# Patient Record
Sex: Female | Born: 1950 | Race: Black or African American | Hispanic: No | Marital: Single | Smoking: Never smoker
Health system: Southern US, Community
[De-identification: ages and names within clinical notes are randomized; demographics above are authoritative.]

## PROBLEM LIST (undated history)

## (undated) DIAGNOSIS — E7800 Pure hypercholesterolemia, unspecified: Secondary | ICD-10-CM

## (undated) DIAGNOSIS — I10 Essential (primary) hypertension: Secondary | ICD-10-CM

---

## 2010-12-31 ENCOUNTER — Emergency Department (HOSPITAL_BASED_OUTPATIENT_CLINIC_OR_DEPARTMENT_OTHER)
Admission: EM | Admit: 2010-12-31 | Discharge: 2010-12-31 | Disposition: A | Discharge status: Home / Self Care | Payer: BC Managed Care – PPO | Attending: Emergency Medicine | Admitting: Emergency Medicine

## 2010-12-31 DIAGNOSIS — H5710 Ocular pain, unspecified eye: Secondary | ICD-10-CM | POA: Insufficient documentation

## 2010-12-31 DIAGNOSIS — E7800 Pure hypercholesterolemia, unspecified: Secondary | ICD-10-CM | POA: Insufficient documentation

## 2010-12-31 DIAGNOSIS — I10 Essential (primary) hypertension: Secondary | ICD-10-CM | POA: Insufficient documentation

## 2013-10-12 DIAGNOSIS — I10 Essential (primary) hypertension: Secondary | ICD-10-CM | POA: Insufficient documentation

## 2016-11-09 ENCOUNTER — Encounter (HOSPITAL_BASED_OUTPATIENT_CLINIC_OR_DEPARTMENT_OTHER): Payer: Self-pay | Admitting: Emergency Medicine

## 2016-11-09 ENCOUNTER — Emergency Department (HOSPITAL_BASED_OUTPATIENT_CLINIC_OR_DEPARTMENT_OTHER)
Admission: EM | Admit: 2016-11-09 | Discharge: 2016-11-09 | Disposition: A | Discharge status: Home / Self Care | Payer: Medicare HMO | Attending: Emergency Medicine | Admitting: Emergency Medicine

## 2016-11-09 DIAGNOSIS — H6123 Impacted cerumen, bilateral: Secondary | ICD-10-CM | POA: Diagnosis not present

## 2016-11-09 DIAGNOSIS — H1033 Unspecified acute conjunctivitis, bilateral: Secondary | ICD-10-CM | POA: Diagnosis not present

## 2016-11-09 DIAGNOSIS — R0981 Nasal congestion: Secondary | ICD-10-CM | POA: Diagnosis present

## 2016-11-09 DIAGNOSIS — J00 Acute nasopharyngitis [common cold]: Secondary | ICD-10-CM

## 2016-11-09 DIAGNOSIS — Z79899 Other long term (current) drug therapy: Secondary | ICD-10-CM | POA: Insufficient documentation

## 2016-11-09 DIAGNOSIS — I10 Essential (primary) hypertension: Secondary | ICD-10-CM | POA: Diagnosis not present

## 2016-11-09 DIAGNOSIS — J3089 Other allergic rhinitis: Secondary | ICD-10-CM | POA: Diagnosis not present

## 2016-11-09 DIAGNOSIS — Z7982 Long term (current) use of aspirin: Secondary | ICD-10-CM | POA: Diagnosis not present

## 2016-11-09 HISTORY — DX: Essential (primary) hypertension: I10

## 2016-11-09 HISTORY — DX: Pure hypercholesterolemia, unspecified: E78.00

## 2016-11-09 MED ORDER — FLUTICASONE PROPIONATE 50 MCG/ACT NA SUSP: 2.0000 | Freq: Every day | NASAL | 0 refills | Status: AC

## 2016-11-09 MED ORDER — ERYTHROMYCIN 5 MG/GM OP OINT: 1.0000 "application " | TOPICAL_OINTMENT | Freq: Four times a day (QID) | OPHTHALMIC | 1 refills | Status: AC

## 2016-11-09 NOTE — ED Triage Notes (Addendum)
Nasal congestion x 1 week with itchy eyes. Taking zyrtec without relief.

## 2016-11-09 NOTE — ED Provider Notes (Signed)
[facility] Provider Note   CSN: [id number] Arrival date & time: 11/09/16  1914  By signing my name below, I, Doreatha Martin, attest that this documentation has been prepared under the direction and in the presence of Everlene Farrier, PA-C. Electronically Signed: Doreatha Martin, ED Scribe. 11/09/16. 10:02 PM.    History   Chief Complaint Chief Complaint  Patient presents with  . Nasal Congestion    HPI Joann Brooks is a 66 y.o. female who presents to the Emergency Department complaining of constant nasal congestion for a week with associated itchy/watery eyes, sinus pressure. She states she has tried Zyrtec-D with no relief.  No worsening factors noted. She denies cocaine use. She denies fever, SOB, postnasal drip, ear pain/pressure.   The history is provided by the patient. No language interpreter was used.    Past Medical History:  Diagnosis Date  . High cholesterol   . Hypertension     There are no active problems to display for this patient.   History reviewed. No pertinent surgical history.  OB History    No data available       Home Medications    Prior to Admission medications   Medication Sig Start Date End Date Taking? Authorizing Provider  amLODipine (NORVASC) 10 MG tablet Take 10 mg by mouth daily.   Yes Historical Provider, MD  aspirin 325 MG tablet Take 325 mg by mouth daily.   Yes Historical Provider, MD  atorvastatin (LIPITOR) 80 MG tablet Take 80 mg by mouth daily.   Yes Historical Provider, MD  benazepril (LOTENSIN) 40 MG tablet Take 40 mg by mouth daily.   Yes Historical Provider, MD  cholecalciferol (VITAMIN D) 1000 units tablet Take 1,000 Units by mouth daily.   Yes Historical Provider, MD  erythromycin ophthalmic ointment Place 1 application into both eyes every 6 (six) hours. Place 1/2 inch ribbon of ointment in the affected eye 4 times a day 11/09/16   Everlene Farrier, PA-C  fluticasone [facility]) 50 MCG/ACT nasal spray Place 2 sprays into  both nostrils daily. 11/09/16   Everlene Farrier, PA-C    Family History No family history on file.  Social History Social History  Substance Use Topics  . Smoking status: Never Smoker  . Smokeless tobacco: Never Used  . Alcohol use Not on file     Allergies   Patient has no known allergies.   Review of Systems Review of Systems  Constitutional: Negative for chills and fever.  HENT: Positive for congestion, rhinorrhea, sinus pressure and sneezing. Negative for ear discharge, ear pain, postnasal drip, sore throat and trouble swallowing.   Eyes: Positive for discharge and itching. Negative for pain and visual disturbance.  Respiratory: Negative for cough and shortness of breath.   Cardiovascular: Negative for chest pain.  Skin: Negative for rash.  Neurological: Negative for headaches.    Physical Exam Updated Vital Signs BP 138/77 (BP Location: Right Arm)   Pulse 76   Temp 97.8 F (36.6 C) (Oral)   Resp 16   Ht  (1.626 m)   Wt 81.6 kg   SpO2 98%   BMI 30.90 kg/m   Physical Exam  Constitutional: She appears well-developed and well-nourished. No distress.  Pt is non-toxic in appearance.   HENT:  Head: Normocephalic and atraumatic.  Right Ear: External ear normal.  Left Ear: External ear normal.  Mouth/Throat: No oropharyngeal exudate.  Cerumen occluding TMs bilaterally. Rhinorrhea and boggy nasal turbinates bilaterally. Throat clear. No tonsillar hypertrophy or  exudates. Rhinorrhea present. No maxillary or frontal sinus tenderness.  Eyes: Conjunctivae are normal. Pupils are equal, round, and reactive to light. Right eye exhibits discharge. Left eye exhibits no discharge.   Conjunctival injection bilaterally. Matting discharge to the right eye.   Neck: Neck supple. No JVD present.  Cardiovascular: Normal rate, regular rhythm, normal heart sounds and intact distal pulses.   Pulmonary/Chest: Effort normal and breath sounds normal. No stridor. No respiratory  distress. She has no wheezes. She has no rales.  Abdominal: Soft. There is no tenderness.  Lymphadenopathy:    She has no cervical adenopathy.  Neurological: She is alert. Coordination normal.  Skin: Skin is warm and dry. No rash noted. She is not diaphoretic.  Psychiatric: She has a normal mood and affect. Her behavior is normal.  Nursing note and vitals reviewed.    ED Treatments / Results   DIAGNOSTIC STUDIES: Oxygen Saturation is 100% on RA, normal by my interpretation.    COORDINATION OF CARE: 10:01 PM Discussed treatment plan with pt at bedside which includes conservative home therapy and pt agreed to plan.  Procedures Procedures (including critical care time)  Medications Ordered in ED Medications - No data to display   Initial Impression / Assessment and Plan / ED Course  I have reviewed the triage vital signs and the nursing notes.     This is a 66 y.o. female who presents to the Emergency Department complaining of constant nasal congestion for a week with associated itchy/watery eyes, sinus pressure.Will send pt home with Flonase and erythromycin ophthalmic ointment. Recommended adequate hydration and Zyrtec use rather than Zyrtec D. She is afebrile and non-toxic appearing.  I advised the patient to follow-up with their primary care provider this week. I advised the patient to return to the emergency department with new or worsening symptoms or new concerns. The patient verbalized understanding and agreement with plan.    Final Clinical Impressions(s) / ED Diagnoses   Final diagnoses:  Non-seasonal allergic rhinitis due to other allergic trigger  Acute nasopharyngitis  Acute conjunctivitis of both eyes, unspecified acute conjunctivitis type    New Prescriptions New Prescriptions   ERYTHROMYCIN OPHTHALMIC OINTMENT    Place 1 application into both eyes every 6 (six) hours. Place 1/2 inch ribbon of ointment in the affected eye 4 times a day   FLUTICASONE (FLONASE)  50 MCG/ACT NASAL SPRAY    Place 2 sprays into both nostrils daily.    I personally performed the services described in this documentation, which was scribed in my presence. The recorded information has been reviewed and is accurate.      Everlene Farrier, PA-C 11/09/16 2209    Linwood Dibbles, MD 11/10/16 [phone]

## 2017-04-13 DIAGNOSIS — E119 Type 2 diabetes mellitus without complications: Secondary | ICD-10-CM | POA: Insufficient documentation

## 2018-08-10 DIAGNOSIS — Q231 Congenital insufficiency of aortic valve: Secondary | ICD-10-CM | POA: Diagnosis not present

## 2018-08-10 DIAGNOSIS — E7800 Pure hypercholesterolemia, unspecified: Secondary | ICD-10-CM | POA: Diagnosis not present

## 2018-08-10 DIAGNOSIS — I350 Nonrheumatic aortic (valve) stenosis: Secondary | ICD-10-CM | POA: Diagnosis not present

## 2018-08-10 DIAGNOSIS — I10 Essential (primary) hypertension: Secondary | ICD-10-CM | POA: Diagnosis not present

## 2018-09-07 DIAGNOSIS — R5383 Other fatigue: Secondary | ICD-10-CM | POA: Diagnosis not present

## 2018-09-07 DIAGNOSIS — E119 Type 2 diabetes mellitus without complications: Secondary | ICD-10-CM | POA: Diagnosis not present

## 2018-09-07 DIAGNOSIS — I10 Essential (primary) hypertension: Secondary | ICD-10-CM | POA: Diagnosis not present

## 2018-09-16 DIAGNOSIS — I10 Essential (primary) hypertension: Secondary | ICD-10-CM | POA: Diagnosis not present

## 2018-09-16 DIAGNOSIS — F439 Reaction to severe stress, unspecified: Secondary | ICD-10-CM | POA: Diagnosis not present

## 2018-09-16 DIAGNOSIS — E119 Type 2 diabetes mellitus without complications: Secondary | ICD-10-CM | POA: Diagnosis not present

## 2018-09-16 DIAGNOSIS — N3281 Overactive bladder: Secondary | ICD-10-CM | POA: Diagnosis not present

## 2018-09-23 DIAGNOSIS — K429 Umbilical hernia without obstruction or gangrene: Secondary | ICD-10-CM | POA: Diagnosis not present

## 2018-09-23 DIAGNOSIS — I352 Nonrheumatic aortic (valve) stenosis with insufficiency: Secondary | ICD-10-CM | POA: Diagnosis not present

## 2018-09-23 DIAGNOSIS — R931 Abnormal findings on diagnostic imaging of heart and coronary circulation: Secondary | ICD-10-CM | POA: Diagnosis not present

## 2018-09-23 DIAGNOSIS — I350 Nonrheumatic aortic (valve) stenosis: Secondary | ICD-10-CM | POA: Diagnosis not present

## 2018-09-23 DIAGNOSIS — I313 Pericardial effusion (noninflammatory): Secondary | ICD-10-CM | POA: Diagnosis not present

## 2018-09-23 DIAGNOSIS — I517 Cardiomegaly: Secondary | ICD-10-CM | POA: Diagnosis not present

## 2018-09-23 DIAGNOSIS — J9811 Atelectasis: Secondary | ICD-10-CM | POA: Diagnosis not present

## 2018-09-23 DIAGNOSIS — I10 Essential (primary) hypertension: Secondary | ICD-10-CM | POA: Diagnosis not present

## 2018-09-23 DIAGNOSIS — K5790 Diverticulosis of intestine, part unspecified, without perforation or abscess without bleeding: Secondary | ICD-10-CM | POA: Diagnosis not present

## 2018-09-23 DIAGNOSIS — I359 Nonrheumatic aortic valve disorder, unspecified: Secondary | ICD-10-CM | POA: Diagnosis not present

## 2018-10-08 ENCOUNTER — Other Ambulatory Visit: Payer: Self-pay | Admitting: *Deleted

## 2018-10-08 NOTE — Patient Outreach (Signed)
  [facility]) Care Management Chronic Special Needs Program  10/08/2018  Name: Joann Brooks DOB: 1950/12/21  MRN: [id number]  Joann Brooks is enrolled in a chronic special needs plan for  Diabetes. A completed health risk assessment has not been received from the client and client has not responded to outreach attempts by their health care concierge.  The client's individualized care plan was developed based on available data.  Plan: . Send unsuccessful outreach letter with a copy of individualized care plan to client . Send Agricultural engineer on HTN and Engineer, production . Send individualized care plan to provider  Chronic care management coordinator, Dudley Major, will attempt outreach in 2-4 months.  Bary Richard RN,CCM,CDE [facility] Care Management Coordinator Office Phone [phone] Office Fax [phone]

## 2018-10-26 DIAGNOSIS — I350 Nonrheumatic aortic (valve) stenosis: Secondary | ICD-10-CM | POA: Diagnosis not present

## 2018-10-26 DIAGNOSIS — I2510 Atherosclerotic heart disease of native coronary artery without angina pectoris: Secondary | ICD-10-CM | POA: Diagnosis not present

## 2018-10-26 DIAGNOSIS — E785 Hyperlipidemia, unspecified: Secondary | ICD-10-CM | POA: Diagnosis not present

## 2018-10-26 DIAGNOSIS — Z01810 Encounter for preprocedural cardiovascular examination: Secondary | ICD-10-CM | POA: Diagnosis not present

## 2018-10-26 DIAGNOSIS — E119 Type 2 diabetes mellitus without complications: Secondary | ICD-10-CM | POA: Diagnosis not present

## 2018-10-26 DIAGNOSIS — I352 Nonrheumatic aortic (valve) stenosis with insufficiency: Secondary | ICD-10-CM | POA: Diagnosis not present

## 2018-10-26 DIAGNOSIS — I10 Essential (primary) hypertension: Secondary | ICD-10-CM | POA: Diagnosis not present

## 2018-12-22 DIAGNOSIS — E119 Type 2 diabetes mellitus without complications: Secondary | ICD-10-CM | POA: Diagnosis not present

## 2018-12-25 DIAGNOSIS — Q231 Congenital insufficiency of aortic valve: Secondary | ICD-10-CM | POA: Diagnosis not present

## 2018-12-25 DIAGNOSIS — I350 Nonrheumatic aortic (valve) stenosis: Secondary | ICD-10-CM | POA: Diagnosis not present

## 2018-12-25 DIAGNOSIS — E7800 Pure hypercholesterolemia, unspecified: Secondary | ICD-10-CM | POA: Diagnosis not present

## 2018-12-25 DIAGNOSIS — I10 Essential (primary) hypertension: Secondary | ICD-10-CM | POA: Diagnosis not present

## 2018-12-29 DIAGNOSIS — E785 Hyperlipidemia, unspecified: Secondary | ICD-10-CM | POA: Diagnosis not present

## 2018-12-29 DIAGNOSIS — E119 Type 2 diabetes mellitus without complications: Secondary | ICD-10-CM | POA: Diagnosis not present

## 2018-12-29 DIAGNOSIS — E1121 Type 2 diabetes mellitus with diabetic nephropathy: Secondary | ICD-10-CM | POA: Diagnosis not present

## 2018-12-29 DIAGNOSIS — F439 Reaction to severe stress, unspecified: Secondary | ICD-10-CM | POA: Diagnosis not present

## 2019-01-07 DIAGNOSIS — I10 Essential (primary) hypertension: Secondary | ICD-10-CM | POA: Diagnosis not present

## 2019-01-07 DIAGNOSIS — J309 Allergic rhinitis, unspecified: Secondary | ICD-10-CM | POA: Diagnosis not present

## 2019-01-07 DIAGNOSIS — E1136 Type 2 diabetes mellitus with diabetic cataract: Secondary | ICD-10-CM | POA: Diagnosis not present

## 2019-01-07 DIAGNOSIS — H42 Glaucoma in diseases classified elsewhere: Secondary | ICD-10-CM | POA: Diagnosis not present

## 2019-01-07 DIAGNOSIS — Z7984 Long term (current) use of oral hypoglycemic drugs: Secondary | ICD-10-CM | POA: Diagnosis not present

## 2019-01-07 DIAGNOSIS — I313 Pericardial effusion (noninflammatory): Secondary | ICD-10-CM | POA: Diagnosis not present

## 2019-01-07 DIAGNOSIS — M8580 Other specified disorders of bone density and structure, unspecified site: Secondary | ICD-10-CM | POA: Diagnosis not present

## 2019-01-07 DIAGNOSIS — Z01818 Encounter for other preprocedural examination: Secondary | ICD-10-CM | POA: Diagnosis not present

## 2019-01-07 DIAGNOSIS — E785 Hyperlipidemia, unspecified: Secondary | ICD-10-CM | POA: Diagnosis not present

## 2019-01-07 DIAGNOSIS — I359 Nonrheumatic aortic valve disorder, unspecified: Secondary | ICD-10-CM | POA: Diagnosis not present

## 2019-01-07 DIAGNOSIS — Z7982 Long term (current) use of aspirin: Secondary | ICD-10-CM | POA: Diagnosis not present

## 2019-01-07 DIAGNOSIS — B009 Herpesviral infection, unspecified: Secondary | ICD-10-CM | POA: Diagnosis not present

## 2019-01-07 DIAGNOSIS — E559 Vitamin D deficiency, unspecified: Secondary | ICD-10-CM | POA: Diagnosis not present

## 2019-01-07 DIAGNOSIS — E1139 Type 2 diabetes mellitus with other diabetic ophthalmic complication: Secondary | ICD-10-CM | POA: Diagnosis not present

## 2019-01-07 DIAGNOSIS — I6523 Occlusion and stenosis of bilateral carotid arteries: Secondary | ICD-10-CM | POA: Diagnosis not present

## 2019-01-07 DIAGNOSIS — E119 Type 2 diabetes mellitus without complications: Secondary | ICD-10-CM | POA: Diagnosis not present

## 2019-01-07 DIAGNOSIS — I350 Nonrheumatic aortic (valve) stenosis: Secondary | ICD-10-CM | POA: Diagnosis not present

## 2019-01-13 DIAGNOSIS — Z1231 Encounter for screening mammogram for malignant neoplasm of breast: Secondary | ICD-10-CM | POA: Diagnosis not present

## 2019-01-22 DIAGNOSIS — Z1159 Encounter for screening for other viral diseases: Secondary | ICD-10-CM | POA: Diagnosis not present

## 2019-01-25 DIAGNOSIS — E878 Other disorders of electrolyte and fluid balance, not elsewhere classified: Secondary | ICD-10-CM | POA: Diagnosis not present

## 2019-01-25 DIAGNOSIS — J9571 Accidental puncture and laceration of a respiratory system organ or structure during a respiratory system procedure: Secondary | ICD-10-CM | POA: Diagnosis not present

## 2019-01-25 DIAGNOSIS — D689 Coagulation defect, unspecified: Secondary | ICD-10-CM | POA: Diagnosis not present

## 2019-01-25 DIAGNOSIS — H409 Unspecified glaucoma: Secondary | ICD-10-CM | POA: Diagnosis not present

## 2019-01-25 DIAGNOSIS — Z4682 Encounter for fitting and adjustment of non-vascular catheter: Secondary | ICD-10-CM | POA: Diagnosis not present

## 2019-01-25 DIAGNOSIS — I97618 Postprocedural hemorrhage and hematoma of a circulatory system organ or structure following other circulatory system procedure: Secondary | ICD-10-CM | POA: Diagnosis not present

## 2019-01-25 DIAGNOSIS — I340 Nonrheumatic mitral (valve) insufficiency: Secondary | ICD-10-CM | POA: Diagnosis not present

## 2019-01-25 DIAGNOSIS — I959 Hypotension, unspecified: Secondary | ICD-10-CM | POA: Diagnosis not present

## 2019-01-25 DIAGNOSIS — D696 Thrombocytopenia, unspecified: Secondary | ICD-10-CM | POA: Diagnosis not present

## 2019-01-25 DIAGNOSIS — R918 Other nonspecific abnormal finding of lung field: Secondary | ICD-10-CM | POA: Diagnosis not present

## 2019-01-25 DIAGNOSIS — Z1159 Encounter for screening for other viral diseases: Secondary | ICD-10-CM | POA: Diagnosis not present

## 2019-01-25 DIAGNOSIS — I10 Essential (primary) hypertension: Secondary | ICD-10-CM | POA: Diagnosis not present

## 2019-01-25 DIAGNOSIS — E785 Hyperlipidemia, unspecified: Secondary | ICD-10-CM | POA: Diagnosis not present

## 2019-01-25 DIAGNOSIS — Z452 Encounter for adjustment and management of vascular access device: Secondary | ICD-10-CM | POA: Diagnosis not present

## 2019-01-25 DIAGNOSIS — I359 Nonrheumatic aortic valve disorder, unspecified: Secondary | ICD-10-CM | POA: Diagnosis not present

## 2019-01-25 DIAGNOSIS — D62 Acute posthemorrhagic anemia: Secondary | ICD-10-CM | POA: Diagnosis not present

## 2019-01-25 DIAGNOSIS — J90 Pleural effusion, not elsewhere classified: Secondary | ICD-10-CM | POA: Diagnosis not present

## 2019-01-25 DIAGNOSIS — Z952 Presence of prosthetic heart valve: Secondary | ICD-10-CM | POA: Diagnosis not present

## 2019-01-25 DIAGNOSIS — I349 Nonrheumatic mitral valve disorder, unspecified: Secondary | ICD-10-CM | POA: Diagnosis not present

## 2019-01-25 DIAGNOSIS — J939 Pneumothorax, unspecified: Secondary | ICD-10-CM | POA: Diagnosis not present

## 2019-01-25 DIAGNOSIS — D72829 Elevated white blood cell count, unspecified: Secondary | ICD-10-CM | POA: Diagnosis not present

## 2019-01-25 DIAGNOSIS — E861 Hypovolemia: Secondary | ICD-10-CM | POA: Diagnosis not present

## 2019-01-25 DIAGNOSIS — Z9849 Cataract extraction status, unspecified eye: Secondary | ICD-10-CM | POA: Diagnosis not present

## 2019-01-25 DIAGNOSIS — E1139 Type 2 diabetes mellitus with other diabetic ophthalmic complication: Secondary | ICD-10-CM | POA: Diagnosis not present

## 2019-01-25 DIAGNOSIS — I350 Nonrheumatic aortic (valve) stenosis: Secondary | ICD-10-CM | POA: Diagnosis not present

## 2019-01-25 DIAGNOSIS — M545 Low back pain: Secondary | ICD-10-CM | POA: Diagnosis not present

## 2019-01-25 DIAGNOSIS — J9811 Atelectasis: Secondary | ICD-10-CM | POA: Diagnosis not present

## 2019-01-25 DIAGNOSIS — T8119XA Other postprocedural shock, initial encounter: Secondary | ICD-10-CM | POA: Diagnosis not present

## 2019-01-25 DIAGNOSIS — R2689 Other abnormalities of gait and mobility: Secondary | ICD-10-CM | POA: Diagnosis not present

## 2019-01-25 DIAGNOSIS — G8918 Other acute postprocedural pain: Secondary | ICD-10-CM | POA: Diagnosis not present

## 2019-01-25 DIAGNOSIS — Z794 Long term (current) use of insulin: Secondary | ICD-10-CM | POA: Diagnosis not present

## 2019-01-25 DIAGNOSIS — I517 Cardiomegaly: Secondary | ICD-10-CM | POA: Diagnosis not present

## 2019-01-25 DIAGNOSIS — Z8679 Personal history of other diseases of the circulatory system: Secondary | ICD-10-CM | POA: Diagnosis not present

## 2019-01-26 ENCOUNTER — Other Ambulatory Visit: Payer: Self-pay

## 2019-01-26 MED ORDER — OXYCODONE HCL 5 MG PO TABS
10.00 | ORAL_TABLET | ORAL | Status: DC
Start: ? — End: 2019-01-26

## 2019-01-26 MED ORDER — ASPIRIN 81 MG PO CHEW
81.00 | CHEWABLE_TABLET | ORAL | Status: DC
Start: 2019-01-31 — End: 2019-01-26

## 2019-01-26 MED ORDER — POLYETHYLENE GLYCOL 3350 17 G PO PACK
17.00 | PACK | ORAL | Status: DC
Start: 2019-01-28 — End: 2019-01-26

## 2019-01-26 MED ORDER — CLEVIDIPINE 25 MG/50ML IV EMUL
.00 | INTRAVENOUS | Status: DC
Start: ? — End: 2019-01-26

## 2019-01-26 MED ORDER — INSULIN REGULAR HUMAN 100 UNIT/ML IJ SOLN
0.00 | INTRAMUSCULAR | Status: DC
Start: 2019-01-30 — End: 2019-01-26

## 2019-01-26 MED ORDER — ONDANSETRON HCL 4 MG/2ML IJ SOLN
4.00 | INTRAMUSCULAR | Status: DC
Start: ? — End: 2019-01-26

## 2019-01-26 MED ORDER — ATORVASTATIN CALCIUM 80 MG PO TABS
80.00 | ORAL_TABLET | ORAL | Status: DC
Start: 2019-01-26 — End: 2019-01-26

## 2019-01-26 MED ORDER — MUPIROCIN 2 % EX OINT
1.00 | TOPICAL_OINTMENT | CUTANEOUS | Status: DC
Start: 2019-01-28 — End: 2019-01-26

## 2019-01-26 MED ORDER — FENTANYL CITRATE (PF) 50 MCG/ML IJ SOLN
50.00 | INTRAMUSCULAR | Status: DC
Start: ? — End: 2019-01-26

## 2019-01-26 MED ORDER — AMLODIPINE BESYLATE 10 MG PO TABS
10.00 | ORAL_TABLET | ORAL | Status: DC
Start: 2019-01-27 — End: 2019-01-26

## 2019-01-26 MED ORDER — ESCITALOPRAM OXALATE 10 MG PO TABS
5.00 | ORAL_TABLET | ORAL | Status: DC
Start: 2019-01-31 — End: 2019-01-26

## 2019-01-26 MED ORDER — HYDRALAZINE HCL 20 MG/ML IJ SOLN
10.00 | INTRAMUSCULAR | Status: DC
Start: ? — End: 2019-01-26

## 2019-01-26 MED ORDER — MAGNESIUM SULFATE 2 GM/50ML IV SOLN
2.00 | INTRAVENOUS | Status: DC
Start: ? — End: 2019-01-26

## 2019-01-26 MED ORDER — GENERIC EXTERNAL MEDICATION
Status: DC
Start: ? — End: 2019-01-26

## 2019-01-26 MED ORDER — DOCUSATE SODIUM 100 MG PO CAPS
100.00 | ORAL_CAPSULE | ORAL | Status: DC
Start: 2019-01-30 — End: 2019-01-26

## 2019-01-26 MED ORDER — POTASSIUM CHLORIDE 20 MEQ/100ML IV SOLN
20.00 | INTRAVENOUS | Status: DC
Start: ? — End: 2019-01-26

## 2019-01-26 MED ORDER — HEPARIN SODIUM (PORCINE) 5000 UNIT/ML IJ SOLN
5000.00 | INTRAMUSCULAR | Status: DC
Start: 2019-01-30 — End: 2019-01-26

## 2019-01-26 MED ORDER — OXYCODONE HCL 5 MG PO TABS
5.00 | ORAL_TABLET | ORAL | Status: DC
Start: ? — End: 2019-01-26

## 2019-01-26 MED ORDER — ACETAMINOPHEN 325 MG PO TABS
650.00 | ORAL_TABLET | ORAL | Status: DC
Start: ? — End: 2019-01-26

## 2019-01-26 MED ORDER — DEXTROSE 50 % IV SOLN
12.50 | INTRAVENOUS | Status: DC
Start: ? — End: 2019-01-26

## 2019-01-26 NOTE — Patient Outreach (Signed)
  [place] [facility]) Care Management Chronic Special Needs Program    01/26/2019  Name: Joann Brooks, [place]: 1951-06-17  MRN: [id number]   Ms. Joann Brooks is enrolled in a chronic special needs plan for Diabetes.  Client admitted to [facility] on 01/25/2019 for aortic valve replacement. Individualized Care Plan sent to [facility] utilization management department.   Covering RNCM: Thea Silversmith, RN, MSN, [place] [phone]

## 2019-01-28 MED ORDER — TRAMADOL HCL 50 MG PO TABS
25.00 | ORAL_TABLET | ORAL | Status: DC
Start: ? — End: 2019-01-28

## 2019-01-28 MED ORDER — VITAMIN D3 125 MCG (5000 UT) PO TABS
5000.00 | ORAL_TABLET | ORAL | Status: DC
Start: 2019-01-31 — End: 2019-01-28

## 2019-01-28 MED ORDER — ATORVASTATIN CALCIUM 40 MG PO TABS
40.00 | ORAL_TABLET | ORAL | Status: DC
Start: 2019-01-31 — End: 2019-01-28

## 2019-01-28 MED ORDER — HYDRALAZINE HCL 20 MG/ML IJ SOLN
10.00 | INTRAMUSCULAR | Status: DC
Start: ? — End: 2019-01-28

## 2019-01-28 MED ORDER — LISINOPRIL 5 MG PO TABS
2.50 | ORAL_TABLET | ORAL | Status: DC
Start: 2019-01-31 — End: 2019-01-28

## 2019-01-28 MED ORDER — TRAMADOL HCL 50 MG PO TABS
50.00 | ORAL_TABLET | ORAL | Status: DC
Start: ? — End: 2019-01-28

## 2019-01-31 DIAGNOSIS — J301 Allergic rhinitis due to pollen: Secondary | ICD-10-CM | POA: Diagnosis not present

## 2019-01-31 DIAGNOSIS — Z952 Presence of prosthetic heart valve: Secondary | ICD-10-CM | POA: Diagnosis not present

## 2019-01-31 DIAGNOSIS — E119 Type 2 diabetes mellitus without complications: Secondary | ICD-10-CM | POA: Diagnosis not present

## 2019-01-31 DIAGNOSIS — I10 Essential (primary) hypertension: Secondary | ICD-10-CM | POA: Diagnosis not present

## 2019-01-31 DIAGNOSIS — E559 Vitamin D deficiency, unspecified: Secondary | ICD-10-CM | POA: Diagnosis not present

## 2019-01-31 DIAGNOSIS — E785 Hyperlipidemia, unspecified: Secondary | ICD-10-CM | POA: Diagnosis not present

## 2019-01-31 DIAGNOSIS — D649 Anemia, unspecified: Secondary | ICD-10-CM | POA: Diagnosis not present

## 2019-01-31 DIAGNOSIS — Z7982 Long term (current) use of aspirin: Secondary | ICD-10-CM | POA: Diagnosis not present

## 2019-01-31 DIAGNOSIS — Z79891 Long term (current) use of opiate analgesic: Secondary | ICD-10-CM | POA: Diagnosis not present

## 2019-01-31 DIAGNOSIS — Z48812 Encounter for surgical aftercare following surgery on the circulatory system: Secondary | ICD-10-CM | POA: Diagnosis not present

## 2019-01-31 DIAGNOSIS — H409 Unspecified glaucoma: Secondary | ICD-10-CM | POA: Diagnosis not present

## 2019-01-31 DIAGNOSIS — M8580 Other specified disorders of bone density and structure, unspecified site: Secondary | ICD-10-CM | POA: Diagnosis not present

## 2019-01-31 DIAGNOSIS — M1990 Unspecified osteoarthritis, unspecified site: Secondary | ICD-10-CM | POA: Diagnosis not present

## 2019-02-01 MED ORDER — METOPROLOL TARTRATE 25 MG PO TABS
12.50 | ORAL_TABLET | ORAL | Status: DC
Start: 2019-01-30 — End: 2019-02-01

## 2019-02-01 MED ORDER — POLYETHYLENE GLYCOL 3350 17 G PO PACK
17.00 | PACK | ORAL | Status: DC
Start: 2019-01-31 — End: 2019-02-01

## 2019-02-01 MED ORDER — IPRATROPIUM BROMIDE 0.02 % IN SOLN
500.00 | RESPIRATORY_TRACT | Status: DC
Start: 2019-01-30 — End: 2019-02-01

## 2019-02-01 MED ORDER — MAGNESIUM OXIDE 400 MG PO TABS
400.00 | ORAL_TABLET | ORAL | Status: DC
Start: 2019-01-30 — End: 2019-02-01

## 2019-02-01 MED ORDER — PROMETHAZINE HCL 25 MG PO TABS
12.50 | ORAL_TABLET | ORAL | Status: DC
Start: ? — End: 2019-02-01

## 2019-02-02 ENCOUNTER — Other Ambulatory Visit: Payer: Self-pay

## 2019-02-02 NOTE — Patient Outreach (Signed)
  [place] [facility]) Care Management Chronic Special Needs Program  02/02/2019  Name: Joann Brooks DOB: [birth year]  MRN: [id number]  Ms. Joann Brooks is enrolled in a chronic special needs plan for Diabetes. Reviewed and updated care plan. Client admitted to [facility] on 01/25/2019 for aortic valve replacement and was d/c on 01/30/19  Subjective: Client states that she is staying with her son in [place] for about the next month while she is healing from her surgery.  States that the nurse from the home health agency came to see her yesterday.  States her son and daughter-in-law are helping her as needed.  STaes she has her medications and she know when to call the doctor.  Goals Addressed            This Visit's Progress   . Client understands the importance of follow-up with providers by attending scheduled visits   On track   . Client will report no worsening of symptoms related to heart disease within the next 6 months      . Client will verbalize knowledge of self management of Hypertension as evidences by BP reading of 140/90 or less; or as defined by provider   On track   . General - Client will not be readmitted within 30 days (C-SNP)       Please follow discharge instructions and call provider if you have any questions. Please attend all follow up appointments as scheduled. Please take your medications as prescribed. Please call 24 Hour nurse advice line as needed [phone]).     Marland Kitchen HEMOGLOBIN A1C < 7.0       Diabetes self management actions:  Glucose monitoring per provider recommendations  Perform Quality checks on blood meter  Eat Healthy  Check feet daily  Visit provider every 3-6 months as directed  Hbg A1C level every 3-6 months.  Eye Exam yearly    . Maintain timely refills of diabetic medication as prescribed within the year .   On track   . Obtain annual  Lipid Profile, LDL-C   On track   . Obtain Annual Eye  (retinal)  Exam    On track   . Obtain Annual Foot Exam   On track   . Obtain annual screen for micro albuminuria (urine) , nephropathy (kidney problems)   On track   . Obtain Hemoglobin A1C at least 2 times per year   On track   . Visit Primary Care Provider or Endocrinologist at least 2 times per year    On track    Transition of care call completed  Reviewed d/c instructions and precautions with client Reviewed s/s to call MD  Reviewed number for 24 hour nurse Line Discussed COVID19 cause, symptoms, precautions (social distancing, stay at home order, hand washing), confirmed client knows how to contact provider Plan:  Send successful outreach letter with a copy of their individualized care plan, Send individual care plan to provider and Send educational material-EMMI- Aortic Valve Replacement  Chronic care management coordinator will outreach in:  1 month or sooner as needed     Peter Garter RN, Jackquline Denmark, [place] Management Coordinator [place] Management [phone]

## 2019-02-03 ENCOUNTER — Other Ambulatory Visit: Payer: Self-pay

## 2019-02-03 NOTE — Patient Outreach (Addendum)
  [place] [facility]) Care Management Chronic Special Needs Program   02/03/2019  Name: Joann Brooks, DOB: August 20, 1950  MRN: [id number]  The client was discussed in today's interdisciplinary care team meeting.  The following issues were discussed:  Changes in health status, Care Plan, Coordination of care and Care transitions  Participants present:   Mahlon Gammon, MSN, RN, CCM, CNS    Thea Silversmith, MSN, RN, CCM   Melissa Sandlin RN,BSN,CCM, CDE     Maryella Shivers, MD  Gilda Crease, PharmD, RPh Bary Castilla, RN, BSN, MS, CCM Coralie Carpen, MD  Recommendations:  Continue to follow and monitor clients follow up with providers and assist as needed  Plan: Plan to outreach on next scheduled call or sooner if needed  Follow-up: one month   Peter Garter RN, Jackquline Denmark, [place] Management Coordinator [place] Management [phone]

## 2019-02-11 DIAGNOSIS — J9811 Atelectasis: Secondary | ICD-10-CM | POA: Diagnosis not present

## 2019-02-11 DIAGNOSIS — Z79899 Other long term (current) drug therapy: Secondary | ICD-10-CM | POA: Diagnosis not present

## 2019-02-11 DIAGNOSIS — Z7982 Long term (current) use of aspirin: Secondary | ICD-10-CM | POA: Diagnosis not present

## 2019-02-11 DIAGNOSIS — J90 Pleural effusion, not elsewhere classified: Secondary | ICD-10-CM | POA: Diagnosis not present

## 2019-02-11 DIAGNOSIS — I359 Nonrheumatic aortic valve disorder, unspecified: Secondary | ICD-10-CM | POA: Diagnosis not present

## 2019-02-19 DIAGNOSIS — E785 Hyperlipidemia, unspecified: Secondary | ICD-10-CM | POA: Diagnosis not present

## 2019-02-19 DIAGNOSIS — J9811 Atelectasis: Secondary | ICD-10-CM | POA: Diagnosis not present

## 2019-02-19 DIAGNOSIS — E119 Type 2 diabetes mellitus without complications: Secondary | ICD-10-CM | POA: Diagnosis not present

## 2019-02-19 DIAGNOSIS — Z1159 Encounter for screening for other viral diseases: Secondary | ICD-10-CM | POA: Diagnosis not present

## 2019-02-19 DIAGNOSIS — Z952 Presence of prosthetic heart valve: Secondary | ICD-10-CM | POA: Diagnosis not present

## 2019-02-19 DIAGNOSIS — J90 Pleural effusion, not elsewhere classified: Secondary | ICD-10-CM | POA: Diagnosis not present

## 2019-02-19 DIAGNOSIS — I350 Nonrheumatic aortic (valve) stenosis: Secondary | ICD-10-CM | POA: Diagnosis not present

## 2019-02-19 DIAGNOSIS — E0590 Thyrotoxicosis, unspecified without thyrotoxic crisis or storm: Secondary | ICD-10-CM | POA: Diagnosis not present

## 2019-02-19 DIAGNOSIS — I4510 Unspecified right bundle-branch block: Secondary | ICD-10-CM | POA: Diagnosis not present

## 2019-02-19 DIAGNOSIS — I252 Old myocardial infarction: Secondary | ICD-10-CM | POA: Diagnosis not present

## 2019-02-19 DIAGNOSIS — R000 Tachycardia, unspecified: Secondary | ICD-10-CM | POA: Diagnosis not present

## 2019-02-19 DIAGNOSIS — I10 Essential (primary) hypertension: Secondary | ICD-10-CM | POA: Diagnosis not present

## 2019-02-19 DIAGNOSIS — Z7901 Long term (current) use of anticoagulants: Secondary | ICD-10-CM | POA: Diagnosis not present

## 2019-02-19 DIAGNOSIS — Z7982 Long term (current) use of aspirin: Secondary | ICD-10-CM | POA: Diagnosis not present

## 2019-02-19 DIAGNOSIS — Z20828 Contact with and (suspected) exposure to other viral communicable diseases: Secondary | ICD-10-CM | POA: Diagnosis not present

## 2019-02-19 DIAGNOSIS — D72829 Elevated white blood cell count, unspecified: Secondary | ICD-10-CM | POA: Diagnosis not present

## 2019-02-19 DIAGNOSIS — R0600 Dyspnea, unspecified: Secondary | ICD-10-CM | POA: Diagnosis not present

## 2019-02-19 DIAGNOSIS — J811 Chronic pulmonary edema: Secondary | ICD-10-CM | POA: Diagnosis not present

## 2019-02-20 DIAGNOSIS — I10 Essential (primary) hypertension: Secondary | ICD-10-CM | POA: Diagnosis not present

## 2019-02-20 DIAGNOSIS — Z952 Presence of prosthetic heart valve: Secondary | ICD-10-CM | POA: Diagnosis not present

## 2019-02-20 DIAGNOSIS — I517 Cardiomegaly: Secondary | ICD-10-CM | POA: Diagnosis not present

## 2019-02-20 DIAGNOSIS — J9811 Atelectasis: Secondary | ICD-10-CM | POA: Diagnosis not present

## 2019-02-20 DIAGNOSIS — R0789 Other chest pain: Secondary | ICD-10-CM | POA: Diagnosis not present

## 2019-02-20 DIAGNOSIS — R000 Tachycardia, unspecified: Secondary | ICD-10-CM | POA: Diagnosis not present

## 2019-02-20 DIAGNOSIS — R0602 Shortness of breath: Secondary | ICD-10-CM | POA: Diagnosis not present

## 2019-02-20 DIAGNOSIS — J90 Pleural effusion, not elsewhere classified: Secondary | ICD-10-CM | POA: Diagnosis not present

## 2019-02-21 DIAGNOSIS — J90 Pleural effusion, not elsewhere classified: Secondary | ICD-10-CM | POA: Diagnosis not present

## 2019-02-21 DIAGNOSIS — R000 Tachycardia, unspecified: Secondary | ICD-10-CM | POA: Diagnosis not present

## 2019-02-21 DIAGNOSIS — I10 Essential (primary) hypertension: Secondary | ICD-10-CM | POA: Diagnosis not present

## 2019-02-21 DIAGNOSIS — R918 Other nonspecific abnormal finding of lung field: Secondary | ICD-10-CM | POA: Diagnosis not present

## 2019-02-21 DIAGNOSIS — R0789 Other chest pain: Secondary | ICD-10-CM | POA: Diagnosis not present

## 2019-02-21 DIAGNOSIS — R778 Other specified abnormalities of plasma proteins: Secondary | ICD-10-CM | POA: Diagnosis not present

## 2019-02-22 DIAGNOSIS — R55 Syncope and collapse: Secondary | ICD-10-CM | POA: Diagnosis not present

## 2019-02-22 DIAGNOSIS — R9431 Abnormal electrocardiogram [ECG] [EKG]: Secondary | ICD-10-CM | POA: Diagnosis not present

## 2019-02-22 DIAGNOSIS — Z952 Presence of prosthetic heart valve: Secondary | ICD-10-CM | POA: Insufficient documentation

## 2019-02-22 DIAGNOSIS — E119 Type 2 diabetes mellitus without complications: Secondary | ICD-10-CM | POA: Diagnosis not present

## 2019-02-22 DIAGNOSIS — I358 Other nonrheumatic aortic valve disorders: Secondary | ICD-10-CM | POA: Diagnosis not present

## 2019-02-22 DIAGNOSIS — I10 Essential (primary) hypertension: Secondary | ICD-10-CM | POA: Diagnosis not present

## 2019-02-22 DIAGNOSIS — J90 Pleural effusion, not elsewhere classified: Secondary | ICD-10-CM | POA: Diagnosis not present

## 2019-02-23 MED ORDER — ATORVASTATIN CALCIUM 40 MG PO TABS
40.00 | ORAL_TABLET | ORAL | Status: DC
Start: 2019-02-23 — End: 2019-02-23

## 2019-02-23 MED ORDER — LISINOPRIL 20 MG PO TABS
20.00 | ORAL_TABLET | ORAL | Status: DC
Start: 2019-02-23 — End: 2019-02-23

## 2019-02-23 MED ORDER — CHOLECALCIFEROL 25 MCG (1000 UT) PO TABS
1000.00 | ORAL_TABLET | ORAL | Status: DC
Start: 2019-02-23 — End: 2019-02-23

## 2019-02-23 MED ORDER — AMLODIPINE BESYLATE 5 MG PO TABS
5.00 | ORAL_TABLET | ORAL | Status: DC
Start: 2019-02-23 — End: 2019-02-23

## 2019-02-23 MED ORDER — ASPIRIN 81 MG PO CHEW
81.00 | CHEWABLE_TABLET | ORAL | Status: DC
Start: 2019-02-23 — End: 2019-02-23

## 2019-02-23 MED ORDER — METOPROLOL SUCCINATE ER 50 MG PO TB24
50.00 | ORAL_TABLET | ORAL | Status: DC
Start: 2019-02-23 — End: 2019-02-23

## 2019-02-23 MED ORDER — INSULIN LISPRO 100 UNIT/ML ~~LOC~~ SOLN
0.00 | SUBCUTANEOUS | Status: DC
Start: 2019-02-22 — End: 2019-02-23

## 2019-02-23 MED ORDER — BENICAR 20 MG PO TABS
12.50 | ORAL_TABLET | ORAL | Status: DC
Start: ? — End: 2019-02-23

## 2019-02-23 MED ORDER — ENOXAPARIN SODIUM 40 MG/0.4ML ~~LOC~~ SOLN
40.00 | SUBCUTANEOUS | Status: DC
Start: 2019-02-22 — End: 2019-02-23

## 2019-03-01 DIAGNOSIS — E785 Hyperlipidemia, unspecified: Secondary | ICD-10-CM | POA: Diagnosis not present

## 2019-03-01 DIAGNOSIS — Z7982 Long term (current) use of aspirin: Secondary | ICD-10-CM | POA: Diagnosis not present

## 2019-03-01 DIAGNOSIS — I10 Essential (primary) hypertension: Secondary | ICD-10-CM | POA: Diagnosis not present

## 2019-03-01 DIAGNOSIS — E559 Vitamin D deficiency, unspecified: Secondary | ICD-10-CM | POA: Diagnosis not present

## 2019-03-01 DIAGNOSIS — M8580 Other specified disorders of bone density and structure, unspecified site: Secondary | ICD-10-CM | POA: Diagnosis not present

## 2019-03-01 DIAGNOSIS — M1990 Unspecified osteoarthritis, unspecified site: Secondary | ICD-10-CM | POA: Diagnosis not present

## 2019-03-01 DIAGNOSIS — Z79891 Long term (current) use of opiate analgesic: Secondary | ICD-10-CM | POA: Diagnosis not present

## 2019-03-01 DIAGNOSIS — D649 Anemia, unspecified: Secondary | ICD-10-CM | POA: Diagnosis not present

## 2019-03-01 DIAGNOSIS — Z48812 Encounter for surgical aftercare following surgery on the circulatory system: Secondary | ICD-10-CM | POA: Diagnosis not present

## 2019-03-01 DIAGNOSIS — E119 Type 2 diabetes mellitus without complications: Secondary | ICD-10-CM | POA: Diagnosis not present

## 2019-03-01 DIAGNOSIS — Z952 Presence of prosthetic heart valve: Secondary | ICD-10-CM | POA: Diagnosis not present

## 2019-03-01 DIAGNOSIS — J301 Allergic rhinitis due to pollen: Secondary | ICD-10-CM | POA: Diagnosis not present

## 2019-03-01 DIAGNOSIS — H409 Unspecified glaucoma: Secondary | ICD-10-CM | POA: Diagnosis not present

## 2019-03-03 ENCOUNTER — Other Ambulatory Visit: Payer: Self-pay

## 2019-03-03 NOTE — Patient Outreach (Signed)
  [place] [facility]) Care Management Chronic Special Needs Program   03/03/2019  Name: Joann Brooks, DOB: [birth year]  MRN: [id number]  The client was discussed in today's interdisciplinary care team meeting.  The following issues were discussed:  Client's needs, Changes in health status, Care Plan, Coordination of care and Care transitions  Participants present:   Mahlon Gammon, MSN, RN, CCM, CNS    Thea Silversmith, MSN, RN, CCM   Kino Dunsworth RN,BSN,CCM, CDE   Marco Collie, MD  Gilda Crease, PharmD, RPh Bary Castilla, RN, BSN, MS, CCM Coralie Carpen, MD  Recommendations:  Continue transiton of care program with [employer] utilization management   Plan:  Follow up with [facility] utilization management transition of care nurse as needed  Follow-up:  As per schedule and as needed  Peter Garter RN, Jackquline Denmark, [place] Management [phone]

## 2019-03-04 ENCOUNTER — Other Ambulatory Visit: Payer: Self-pay

## 2019-03-04 NOTE — Patient Outreach (Signed)
  [place] [facility]) Care Management Chronic Special Needs Program  03/04/2019  Name: Alisia Vanengen DOB: 23-May-1951  MRN: [id number]  Ms. Aradia Estey is enrolled in a chronic special needs plan for Diabetes. Client called with no answer No answer and HIPAA compliant message left. Plan for 2nd outreach call in one week Chronic care management coordinator will attempt outreach in one week.   Peter Garter RN, Jackquline Denmark, CDE Chronic Care Management Coordinator [place] [facility] Care Management [phone]

## 2019-03-09 ENCOUNTER — Other Ambulatory Visit: Payer: Self-pay

## 2019-03-09 NOTE — Patient Outreach (Signed)
  [place] [facility]) Care Management Chronic Special Needs Program  03/09/2019  Name: Zilah Villaflor DOB: [birth year]  MRN: [id number]  Ms. Jolynn Bajorek is enrolled in a chronic special needs plan for Diabetes. Client called with no answer No answer and HIPAA compliant message left. 2nd attempt Plan for 3nd outreach call in one week Chronic care management coordinator will attempt outreach in one week.   Peter Garter RN, Jackquline Denmark, CDE Chronic Care Management Coordinator [place] [facility] Care Management [phone]

## 2019-03-11 ENCOUNTER — Ambulatory Visit: Payer: Self-pay

## 2019-03-16 ENCOUNTER — Other Ambulatory Visit: Payer: Self-pay

## 2019-03-16 NOTE — Patient Outreach (Signed)
  [place] [facility]) Care Management Chronic Special Needs Program  03/16/2019  Name: Joann Brooks DOB: Oct 16, 1950  MRN: [id number]  Ms. [name] is enrolled in a chronic special needs plan for Diabetes. Reviewed and updated care plan.  Subjective:Client states that she is getting better every day.  States she is walking around the house and goes up and down stairs several times a day.  Denies any chest pains or SOB.  States her incision is healing well.  States she is to see her surgeon on Thursday.  States she is weighting daily and she know to call if her weight goes over 150.  States her blood sugars are good ranging 94-115.  States her blood pressure is better with 140-150 over 80-87.   Goals Addressed            This Visit's Progress   . Client understands the importance of follow-up with providers by attending scheduled visits   On track   . Client will report no worsening of symptoms related to heart disease within the next 6 months   Improving   . Client will verbalize knowledge of self management of Hypertension as evidences by BP reading of 140/90 or less; or as defined by provider   On track   . COMPLETED: General - Client will not be readmitted within 30 days (C-SNP)Discharged 01/30/19       Completed no inpatient readmission in 30 days    . HEMOGLOBIN A1C < 7.0       Diabetes self management actions:  Glucose monitoring per provider recommendations  Eat Healthy  Check feet daily  Visit provider every 3-6 months as directed  Hbg A1C level every 3-6 months.  Eye Exam yearly    . Maintain timely refills of diabetic medication as prescribed within the year .   On track   . COMPLETED: Obtain annual  Lipid Profile, LDL-C       Completed 01/26/2019    . Obtain Annual Eye (retinal)  Exam    On track   . Obtain Annual Foot Exam   On track   . COMPLETED: Obtain annual screen for micro albuminuria (urine) , nephropathy (kidney problems)       Completed  09/07/2018    . COMPLETED: Obtain Hemoglobin A1C at least 2 times per year   On track    Checked 09/07/18 and 12/22/18    . Visit Primary Care Provider or Endocrinologist at least 2 times per year    On track    Client is  meeting diabetes self management goal of hemoglobin A1C of <7% with last reading of 5.8%. S/P bicuspid aortic valve replacement on 01/25/19 Reinforced to keep follow up appts with providers. Reviewed s/s to call her provider Reviewed number for 24-hour nurse Line Discussed COVID19 cause, symptoms, precautions (social distancing, stay at home order, hand washing), confirmed client knows how to contact provider. Plan:  Send successful outreach letter with a copy of their individualized care plan and Send individual care plan to provider  Chronic care management coordinator will outreach in:  3 Months     Peter Garter RN, [employer], [place] Management Coordinator [place] Management [phone]

## 2019-03-18 DIAGNOSIS — Z48812 Encounter for surgical aftercare following surgery on the circulatory system: Secondary | ICD-10-CM | POA: Diagnosis not present

## 2019-03-18 DIAGNOSIS — Z7982 Long term (current) use of aspirin: Secondary | ICD-10-CM | POA: Diagnosis not present

## 2019-03-18 DIAGNOSIS — J90 Pleural effusion, not elsewhere classified: Secondary | ICD-10-CM | POA: Diagnosis not present

## 2019-03-18 DIAGNOSIS — Z79899 Other long term (current) drug therapy: Secondary | ICD-10-CM | POA: Diagnosis not present

## 2019-03-18 DIAGNOSIS — I359 Nonrheumatic aortic valve disorder, unspecified: Secondary | ICD-10-CM | POA: Diagnosis not present

## 2019-03-18 DIAGNOSIS — I10 Essential (primary) hypertension: Secondary | ICD-10-CM | POA: Diagnosis not present

## 2019-03-23 DIAGNOSIS — R5383 Other fatigue: Secondary | ICD-10-CM | POA: Diagnosis not present

## 2019-03-23 DIAGNOSIS — Z7984 Long term (current) use of oral hypoglycemic drugs: Secondary | ICD-10-CM | POA: Diagnosis not present

## 2019-03-23 DIAGNOSIS — E119 Type 2 diabetes mellitus without complications: Secondary | ICD-10-CM | POA: Diagnosis not present

## 2019-03-23 DIAGNOSIS — D649 Anemia, unspecified: Secondary | ICD-10-CM | POA: Diagnosis not present

## 2019-03-23 DIAGNOSIS — E785 Hyperlipidemia, unspecified: Secondary | ICD-10-CM | POA: Diagnosis not present

## 2019-03-23 DIAGNOSIS — I10 Essential (primary) hypertension: Secondary | ICD-10-CM | POA: Diagnosis not present

## 2019-03-30 DIAGNOSIS — L292 Pruritus vulvae: Secondary | ICD-10-CM | POA: Diagnosis not present

## 2019-03-30 DIAGNOSIS — I10 Essential (primary) hypertension: Secondary | ICD-10-CM | POA: Diagnosis not present

## 2019-03-30 DIAGNOSIS — D649 Anemia, unspecified: Secondary | ICD-10-CM | POA: Diagnosis not present

## 2019-03-30 DIAGNOSIS — E785 Hyperlipidemia, unspecified: Secondary | ICD-10-CM | POA: Diagnosis not present

## 2019-04-09 DIAGNOSIS — M25512 Pain in left shoulder: Secondary | ICD-10-CM | POA: Diagnosis not present

## 2019-04-13 DIAGNOSIS — Z952 Presence of prosthetic heart valve: Secondary | ICD-10-CM | POA: Diagnosis not present

## 2019-04-19 DIAGNOSIS — Q231 Congenital insufficiency of aortic valve: Secondary | ICD-10-CM | POA: Diagnosis not present

## 2019-04-19 DIAGNOSIS — E7800 Pure hypercholesterolemia, unspecified: Secondary | ICD-10-CM | POA: Diagnosis not present

## 2019-04-19 DIAGNOSIS — I10 Essential (primary) hypertension: Secondary | ICD-10-CM | POA: Diagnosis not present

## 2019-04-19 DIAGNOSIS — I350 Nonrheumatic aortic (valve) stenosis: Secondary | ICD-10-CM | POA: Diagnosis not present

## 2019-04-29 DIAGNOSIS — Z952 Presence of prosthetic heart valve: Secondary | ICD-10-CM | POA: Diagnosis not present

## 2019-05-17 DIAGNOSIS — I10 Essential (primary) hypertension: Secondary | ICD-10-CM | POA: Diagnosis not present

## 2019-05-17 DIAGNOSIS — R0789 Other chest pain: Secondary | ICD-10-CM | POA: Diagnosis not present

## 2019-05-17 DIAGNOSIS — Z79899 Other long term (current) drug therapy: Secondary | ICD-10-CM | POA: Diagnosis not present

## 2019-05-17 DIAGNOSIS — Z23 Encounter for immunization: Secondary | ICD-10-CM | POA: Diagnosis not present

## 2019-06-08 ENCOUNTER — Other Ambulatory Visit: Payer: Self-pay

## 2019-06-08 NOTE — Patient Outreach (Signed)
  [place] [facility]) Care Management Chronic Special Needs Program  06/08/2019  Name: Joann Brooks DOB: [birth year]  MRN: [id number]  [name] is enrolled in a chronic special needs plan for Diabetes. Client called with no answer No answer and HIPAA compliant message left. 1st attempt Plan for 2nd outreach call in one week Chronic care management coordinator will attempt outreach in one week.   Peter Garter RN, Jackquline Denmark, CDE Chronic Care Management Coordinator [place] [facility] Care Management [phone]

## 2019-06-11 ENCOUNTER — Other Ambulatory Visit: Payer: Self-pay

## 2019-06-11 NOTE — Patient Outreach (Signed)
  [place] [facility]) Care Management Chronic Special Needs Program  06/11/2019  Name: Caniya Tagle DOB: October 23, 1950  MRN: [id number]  Ms. Baneza Bartoszek is enrolled in a chronic special needs plan for Diabetes. Reviewed and updated care plan.  Subjective: Client states that she has been doing good since her surgery and she has been back home for about 2 months.  States she was doing cardiac rehab but had to stop due to chest wall pain.  States she is now trying to walk at least 3 times a week and she goes up and down stairs several times a day.  States that her blood sugars range for 80-112.   Goals Addressed            This Visit's Progress   . Client understands the importance of follow-up with providers by attending scheduled visits   On track   . Client will report no worsening of symptoms related to heart disease within the next 6 months(continue 06/11/19)   On track   . Client will verbalize knowledge of self management of Hypertension as evidences by BP reading of 140/90 or less; or as defined by provider   On track   . HEMOGLOBIN A1C < 7.0        Diabetes self management actions:  Glucose monitoring per provider recommendations  Eat Healthy  Check feet daily  Visit provider every 3-6 months as directed  Hbg A1C level every 3-6 months.  Eye Exam yearly    . Obtain Annual Eye (retinal)  Exam    On track    Call to schedule eye exam    . COMPLETED: Visit Primary Care Provider or Endocrinologist at least 2 times per year        Visits 09/16/18,12/29/18,05/17/19     Client is  meeting diabetes self management goal of hemoglobin A1C of <7% with last reading of 5.8% Reviewed to follow a low CHO low sodium diet Encouraged to continue to get regular walks and to try to increase the time or frequency that she walks Encouraged to call and schedule her annual eye exam Reviewed number for 24-hour nurse Line Reviewed COVID 19 precautions  Plan:  Send successful outreach  letter with a copy of their individualized care plan and Send individual care plan to provider  Chronic care management coordinator will outreach in:  4-6 Months    Peter Garter RN, [facility], [place] Management Coordinator [place] Management [phone]

## 2019-06-29 DIAGNOSIS — Z20828 Contact with and (suspected) exposure to other viral communicable diseases: Secondary | ICD-10-CM | POA: Diagnosis not present

## 2019-06-29 DIAGNOSIS — R05 Cough: Secondary | ICD-10-CM | POA: Diagnosis not present

## 2019-06-30 DIAGNOSIS — U071 COVID-19: Secondary | ICD-10-CM | POA: Diagnosis not present

## 2019-06-30 DIAGNOSIS — Z20828 Contact with and (suspected) exposure to other viral communicable diseases: Secondary | ICD-10-CM | POA: Diagnosis not present

## 2019-07-20 DIAGNOSIS — F419 Anxiety disorder, unspecified: Secondary | ICD-10-CM | POA: Diagnosis not present

## 2019-07-20 DIAGNOSIS — L905 Scar conditions and fibrosis of skin: Secondary | ICD-10-CM | POA: Diagnosis not present

## 2019-07-20 DIAGNOSIS — H6123 Impacted cerumen, bilateral: Secondary | ICD-10-CM | POA: Diagnosis not present

## 2019-07-20 DIAGNOSIS — R42 Dizziness and giddiness: Secondary | ICD-10-CM | POA: Diagnosis not present

## 2019-08-06 ENCOUNTER — Other Ambulatory Visit: Payer: Self-pay

## 2019-08-06 NOTE — Patient Outreach (Signed)
  [facility]) Care Management Chronic Special Needs Program    08/06/2019  Name: Joann Brooks, DOB: 20-May-1951  MRN: [id number]   Ms. Joann Brooks is enrolled in a chronic special needs plan for Diabetes. Case closed as client has disenrolled from Chronic Special Needs Plan Plan to send case closure letter to [facility] [employer] to notify client of dis-enrollment from plan Dudley Major RN, [facility], CDE Chronic Care Management Coordinator [facility] Care Management [phone]

## 2019-08-24 ENCOUNTER — Ambulatory Visit (INDEPENDENT_AMBULATORY_CARE_PROVIDER_SITE_OTHER): Payer: Medicare Other | Admitting: Plastic Surgery

## 2019-08-24 ENCOUNTER — Encounter: Payer: Self-pay | Admitting: Plastic Surgery

## 2019-08-24 ENCOUNTER — Other Ambulatory Visit: Payer: Self-pay

## 2019-08-24 DIAGNOSIS — L910 Hypertrophic scar: Secondary | ICD-10-CM

## 2019-08-24 NOTE — Progress Notes (Addendum)
Patient ID: Joann Brooks, female    DOB: January 15, 1951, 69 y.o.   MRN: [id number]   Chief Complaint  Patient presents with  . Advice Only    The patient is a 69 year old female here for evaluation of her chest scar.  She underwent cardiac surgery which went well.  She also has diabetes.  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  She has not had anything done to it to this point in time.  The main complaint is itching.  Nothing seems to help.   Review of Systems  Constitutional: Negative.  Negative for activity change and appetite change.  Eyes: Negative.   Respiratory: Positive for chest tightness.   Cardiovascular: Negative.   Gastrointestinal: Negative for abdominal distention and abdominal pain.  Endocrine: Negative.   Genitourinary: Negative.   Musculoskeletal: Negative.   Skin: Positive for color change. Negative for rash and wound.    Past Medical History:  Diagnosis Date  . High cholesterol   . Hypertension     History reviewed. No pertinent surgical history.    Current Outpatient Medications:  .  amLODipine (NORVASC) 5 MG tablet, Take 5 mg by mouth daily., Disp: , Rfl:  .  ASPIRIN LOW DOSE 81 MG EC tablet, Take 81 mg by mouth daily., Disp: , Rfl:  .  atorvastatin (LIPITOR) 80 MG tablet, Take 80 mg by mouth daily., Disp: , Rfl:  .  cholecalciferol (VITAMIN D) 1000 units tablet, Take 1,000 Units by mouth daily., Disp: , Rfl:  .  erythromycin ophthalmic ointment, Place 1 application into both eyes every 6 (six) hours. Place 1/2 inch ribbon of ointment in the affected eye 4 times a day, Disp: 1 g, Rfl: 1 .  escitalopram (LEXAPRO) 5 MG tablet, Take by mouth., Disp: , Rfl:  .  fluticasone (FLONASE) 50 MCG/ACT nasal spray, Place 2 sprays into both nostrils daily., Disp: 16 g, Rfl: 0 .  lisinopril (ZESTRIL) 20 MG tablet, Take by mouth., Disp: , Rfl:  .  metFORMIN (GLUCOPHAGE-XR) 500 MG 24 hr tablet, Take by mouth., Disp: , Rfl:  .  metoprolol  succinate (TOPROL-XL) 50 MG 24 hr tablet, Take 50 mg by mouth daily., Disp: , Rfl:    Objective:   Vitals:   08/24/19 1119  BP: (!) 157/80  Pulse: 68  Temp: 98.4 F (36.9 C)  SpO2: 99%    Physical Exam Vitals and nursing note reviewed.  Constitutional:      Appearance: Normal appearance.  HENT:     Head: Normocephalic and atraumatic.  Cardiovascular:     Rate and Rhythm: Normal rate.  Pulmonary:     Effort: Pulmonary effort is normal. No respiratory distress.  Abdominal:     General: Abdomen is flat. There is no distension.  Neurological:     General: No focal deficit present.     Mental Status: She is alert and oriented to person, place, and time.  Psychiatric:        Mood and Affect: Mood normal.     Assessment & Plan:  Hypertrophic scar  Recommend Kenalog injection of hypertrophic scar of chest after open heart surgery.  Also recommend silicone sheets at night and massage.  Pictures were obtained of the patient and placed in the chart with the patient's or guardian's permission.   Peggye Form, DO   The 21st Century Cures Act was signed into law in 2016 which includes the topic of electronic health records.  This  provides immediate access to information in MyChart.  This includes consultation notes, operative notes, office notes, lab results and pathology reports.  If you have any questions about what you read please let us know at your next visit or call us at the office.  We are right here with you.

## 2019-09-09 ENCOUNTER — Ambulatory Visit: Payer: HMO

## 2019-10-11 ENCOUNTER — Telehealth: Payer: Self-pay | Admitting: Plastic Surgery

## 2019-10-11 NOTE — Telephone Encounter (Signed)

## 2019-10-12 ENCOUNTER — Encounter: Payer: Self-pay | Admitting: Plastic Surgery

## 2019-10-12 ENCOUNTER — Ambulatory Visit: Payer: Medicare Other | Admitting: Plastic Surgery

## 2019-10-12 ENCOUNTER — Other Ambulatory Visit: Payer: Self-pay

## 2019-10-12 VITALS — BP 171/83 | HR 67 | Temp 97.5°F | Ht 64.0 in | Wt 166.0 lb

## 2019-10-12 DIAGNOSIS — L910 Hypertrophic scar: Secondary | ICD-10-CM

## 2019-10-12 NOTE — Progress Notes (Signed)
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  The potential complications and alternatives were explained and the patient consents.  The patient expressed understanding the option of not having the procedure and the risks of a scar.  Time out was called and all information was confirmed to be correct.    The area was prepped with chlorhexidine.  Lidocaine 1% with epinepherine 0.1 Kenalog 1 / 10 mg 0.2 cc was mixed together.  A 27-gauge needle was used to inject the hypertrophic scar.   A dressing was applied.  The patient was given instructions on how to care for the area and a follow up appointment.  Joann Brooks tolerated the procedure well and there were no complications.  This was the first of likely 3 Kenalog injections.  The 21st Century Cures Act was signed into law in 2016 which includes the topic of electronic health records.  This provides immediate access to information in MyChart.  This includes consultation notes, operative notes, office notes, lab results and pathology reports.  If you have any questions about what you read please let us know at your next visit or call us at the office.  We are right here with you.

## 2019-11-30 ENCOUNTER — Ambulatory Visit (INDEPENDENT_AMBULATORY_CARE_PROVIDER_SITE_OTHER): Payer: Medicare Other | Admitting: Plastic Surgery

## 2019-11-30 ENCOUNTER — Encounter: Payer: Self-pay | Admitting: Plastic Surgery

## 2019-11-30 ENCOUNTER — Other Ambulatory Visit: Payer: Self-pay

## 2019-11-30 VITALS — BP 176/83 | HR 67 | Temp 97.5°F | Ht 64.0 in | Wt 171.2 lb

## 2019-11-30 DIAGNOSIS — Z719 Counseling, unspecified: Secondary | ICD-10-CM

## 2019-11-30 DIAGNOSIS — L910 Hypertrophic scar: Secondary | ICD-10-CM

## 2019-11-30 NOTE — Progress Notes (Signed)
Procedure Note  Preoperative Dx: Hypertrophic scar of the chest  Postoperative Dx: Same  Procedure: Kenalog injection to hypertrophic scar of the chest 1 x 20 cm  Description of Procedure: Risks and complications were explained to the patient.  Consent was confirmed and the patient understands the risks and benefits.  The potential complications and alternatives were explained and the patient consents.  The patient expressed understanding the option of not having the procedure and the risks of a scar.  Time out was called and all information was confirmed to be correct.    The area was prepped with alcohol.  Lidocaine 1% with epinepherine 0.5 cc and Kenalog 10 mg per 1 mL 0.5 cc was injected into the hypertrophic scar.  A dressing was applied.  The patient was given instructions on how to care for the area and a follow up appointment.  Joann Brooks tolerated the procedure well and there were no complications.  The patient states that she did not see an improvement from the previous injection.  However there was no progression of the hypertrophic scar.  She has been using the silicone sheets.  I did tell her about skinUva and she was going to ask about it upfront.  I would like to see her back for further Kenalog injection in 4 to 6 weeks.

## 2020-01-27 ENCOUNTER — Ambulatory Visit: Payer: Medicare Other | Admitting: Plastic Surgery

## 2020-01-28 ENCOUNTER — Ambulatory Visit (INDEPENDENT_AMBULATORY_CARE_PROVIDER_SITE_OTHER): Payer: Medicare Other | Admitting: Surgical

## 2020-01-28 ENCOUNTER — Encounter: Payer: Self-pay | Admitting: Surgical

## 2020-01-28 ENCOUNTER — Other Ambulatory Visit: Payer: Self-pay

## 2020-01-28 VITALS — BP 158/81 | HR 80 | Temp 97.7°F

## 2020-01-28 DIAGNOSIS — L910 Hypertrophic scar: Secondary | ICD-10-CM | POA: Diagnosis not present

## 2020-01-28 NOTE — Progress Notes (Signed)
   Subjective:     Patient ID: Joann Brooks, female    DOB: 1951-04-16, 69 y.o.   MRN: [id number]  Chief Complaint  Patient presents with  . Follow-up    HPI: The patient is a 69 y.o. female here for follow-up on her hypertrophic scar.  She is here to discuss burning and itching sensation, she was worried she had an infection.  She reports she has noticed this after starting to use the skinuva scar cream.  She is otherwise feeling fine.  She reports she has been applying the scar cream and the silicone scar sheets.   Review of Systems  Constitutional: Negative.   Skin: Positive for color change. Negative for rash and wound.     Objective:   Vital Signs BP (!) 158/81 (BP Location: Left Arm, Patient Position: Sitting, Cuff Size: Large)   Pulse 80   Temp 97.7 F (36.5 C) (Temporal)   SpO2 100%  Vital Signs and Nursing Note Reviewed Physical Exam Constitutional:      General: She is not in acute distress.    Appearance: Normal appearance. She is not ill-appearing.  Pulmonary:     Effort: Pulmonary effort is normal.  Chest:       Comments: Midline sternal hypertrophic scar noted with slight erythema of the periscar area. Neurological:     Mental Status: She is alert. Mental status is at baseline.  Psychiatric:        Mood and Affect: Mood normal.        Behavior: Behavior normal.       Assessment/Plan:     ICD-10-CM   1. Hypertrophic scar  L91.0    No sign of infection.  Discussed with patient the skinuva scar cream can cause the scar/incisions to have a burning sensation and become irritated. If she would like to take a break from using the scar cream, this may improve the irritation, but she would not receive the benefits of the cream.  She has continued to use silicone sheets.  Patient is scheduled for additional steroid injection in August with Dr. Ulice Bold.  We did review EMR photos together today which show some improvement in the thickness of the scar.   There has also been no progression  Pictures were obtained of the patient and placed in the chart with the patient's or guardian's permission.    Kermit Balo [name], PA-C 01/28/2020, 9:10 AM

## 2020-03-07 ENCOUNTER — Ambulatory Visit (INDEPENDENT_AMBULATORY_CARE_PROVIDER_SITE_OTHER): Payer: Medicare Other | Admitting: Plastic Surgery

## 2020-03-07 ENCOUNTER — Encounter: Payer: Self-pay | Admitting: Plastic Surgery

## 2020-03-07 ENCOUNTER — Other Ambulatory Visit: Payer: Self-pay

## 2020-03-07 VITALS — BP 150/77 | HR 80 | Temp 98.0°F | Ht 64.0 in | Wt 167.0 lb

## 2020-03-07 DIAGNOSIS — L910 Hypertrophic scar: Secondary | ICD-10-CM

## 2020-03-07 MED ORDER — FLUOCINONIDE 0.05 % EX CREA: 1.0000 "application " | TOPICAL_CREAM | Freq: Two times a day (BID) | CUTANEOUS | 0 refills | Status: AC

## 2020-03-07 NOTE — Progress Notes (Signed)
   Subjective:    Patient ID: Joann Brooks, female    DOB: [birth year], 69 y.o.   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  We have injected it in the past with Kenalog.  She says in the last few days it has been very itchy.  There is no sign of infection.  There appears to be a little bit of an improvement from the injection.  She is overall not feeling very good today and has an appointment with her primary care physician regarding her breathing.  I do not see any area of concern in her skin on the keloid or chest.  There is no rash.     Review of Systems  Constitutional: Negative.   HENT: Negative for facial swelling.   Eyes: Negative.   Respiratory: Positive for shortness of breath.   Cardiovascular: Negative.   Endocrine: Negative.   Genitourinary: Negative.   Skin: Negative for color change and rash.  Neurological: Negative.   Hematological: Negative.        Objective:   Physical Exam Vitals and nursing note reviewed.  Constitutional:      Appearance: Normal appearance.  HENT:     Head: Normocephalic and atraumatic.  Cardiovascular:     Rate and Rhythm: Normal rate.     Pulses: Normal pulses.  Pulmonary:     Effort: Pulmonary effort is normal. No respiratory distress.     Breath sounds: No stridor.  Chest:    Skin:    General: Skin is warm.  Neurological:     General: No focal deficit present.     Mental Status: She is alert and oriented to person, place, and time.  Psychiatric:        Mood and Affect: Mood normal.        Behavior: Behavior normal.       Assessment & Plan:     ICD-10-CM   1. Hypertrophic scar  L91.0     I will call in some Lidex cream to help calm down the itching.  I recommend Benadryl at night and Claritin in the morning just to help decrease the itching and irritation.  She is going for evaluation from her PCP.  We will hold off on the injection and see her back in 1 month.   Prescription sent to pharmacy.

## 2020-03-17 ENCOUNTER — Encounter (HOSPITAL_BASED_OUTPATIENT_CLINIC_OR_DEPARTMENT_OTHER): Payer: Self-pay | Admitting: Emergency Medicine

## 2020-03-17 ENCOUNTER — Emergency Department (HOSPITAL_BASED_OUTPATIENT_CLINIC_OR_DEPARTMENT_OTHER)
Admission: EM | Admit: 2020-03-17 | Discharge: 2020-03-17 | Disposition: A | Discharge status: Home / Self Care | Payer: Medicare Other | Attending: Emergency Medicine | Admitting: Emergency Medicine

## 2020-03-17 ENCOUNTER — Other Ambulatory Visit: Payer: Self-pay

## 2020-03-17 ENCOUNTER — Emergency Department (HOSPITAL_BASED_OUTPATIENT_CLINIC_OR_DEPARTMENT_OTHER): Payer: Medicare Other

## 2020-03-17 DIAGNOSIS — J40 Bronchitis, not specified as acute or chronic: Secondary | ICD-10-CM | POA: Insufficient documentation

## 2020-03-17 DIAGNOSIS — E119 Type 2 diabetes mellitus without complications: Secondary | ICD-10-CM | POA: Insufficient documentation

## 2020-03-17 DIAGNOSIS — Z7982 Long term (current) use of aspirin: Secondary | ICD-10-CM | POA: Diagnosis not present

## 2020-03-17 DIAGNOSIS — Z79899 Other long term (current) drug therapy: Secondary | ICD-10-CM | POA: Diagnosis not present

## 2020-03-17 DIAGNOSIS — R05 Cough: Secondary | ICD-10-CM

## 2020-03-17 DIAGNOSIS — R059 Cough, unspecified: Secondary | ICD-10-CM

## 2020-03-17 DIAGNOSIS — I10 Essential (primary) hypertension: Secondary | ICD-10-CM | POA: Diagnosis not present

## 2020-03-17 DIAGNOSIS — Z7984 Long term (current) use of oral hypoglycemic drugs: Secondary | ICD-10-CM | POA: Insufficient documentation

## 2020-03-17 MED ORDER — ALBUTEROL SULFATE HFA 108 (90 BASE) MCG/ACT IN AERS
2.0000 | INHALATION_SPRAY | Freq: Once | RESPIRATORY_TRACT | Status: AC
  Administered 2020-03-17: 2 via RESPIRATORY_TRACT
  Filled 2020-03-17: qty 6.7

## 2020-03-17 MED ORDER — DEXAMETHASONE 4 MG PO TABS
8.0000 mg | ORAL_TABLET | Freq: Once | ORAL | Status: AC
  Administered 2020-03-17: 8 mg via ORAL
  Filled 2020-03-17: qty 2

## 2020-03-17 NOTE — ED Provider Notes (Signed)
[facility] EMERGENCY DEPARTMENT Provider Note   CSN: [id number] Arrival date & time: 03/17/20  1211     History Chief Complaint  Patient presents with  . Cough    Joann Brooks is a 69 y.o. female.  HPI   69 year old female with cough.  Onset about a week ago.  Symptoms persistent since.  Occasionally productive for whitish sputum.  No dyspnea.  No fevers or chills.  Feels congested.  She is got a Primatene mist inhaler but has had to actually try using it.  No unusual leg pain or swelling.  No orthopnea.  Past Medical History:  Diagnosis Date  . High cholesterol   . Hypertension     Patient Active Problem List   Diagnosis Date Noted  . Hypertrophic scar 08/24/2019  . Presence of prosthetic heart valve 02/22/2019  . Diabetes mellitus without complication (HCC) 04/13/2017  . Essential hypertension 10/12/2013    History reviewed. No pertinent surgical history.   OB History   No obstetric history on file.    No family history on file.  Social History   Tobacco Use  . Smoking status: Never Smoker  . Smokeless tobacco: Never Used  Substance Use Topics  . Alcohol use: Not Currently  . Drug use: Not Currently   Home Medications Prior to Admission medications   Medication Sig Start Date End Date Taking? Authorizing Provider  amLODipine (NORVASC) 5 MG tablet Take 5 mg by mouth daily. 02/22/19   [provider]  ASPIRIN LOW DOSE 81 MG EC tablet Take 81 mg by mouth daily. 01/29/19   [provider]  atorvastatin (LIPITOR) 80 MG tablet Take 80 mg by mouth daily.    [provider]  cholecalciferol (VITAMIN D) 1000 units tablet Take 1,000 Units by mouth daily.    [provider]  erythromycin ophthalmic ointment Place 1 application into both eyes every 6 (six) hours. Place 1/2 inch ribbon of ointment in the affected eye 4 times a day 11/09/16   Everlene Farrier, PA-C  escitalopram (LEXAPRO) 5 MG tablet Take by mouth. 12/29/18    [provider]  fluocinonide cream (LIDEX) 0.05 % Apply 1 application topically 2 (two) times daily. 03/07/20   Dillingham, Alena Bills, DO  fluticasone (FLONASE) 50 MCG/ACT nasal spray Place 2 sprays into both nostrils daily. 11/09/16   Everlene Farrier, PA-C  lisinopril (ZESTRIL) 20 MG tablet Take by mouth. 02/22/19   [provider]  metFORMIN (GLUCOPHAGE-XR) 500 MG 24 hr tablet Take by mouth. 12/29/18   [provider]  metoprolol succinate (TOPROL-XL) 50 MG 24 hr tablet Take 50 mg by mouth daily. 02/22/19   [provider]   Allergies    Clarithromycin  Review of Systems   Review of Systems All systems reviewed and negative, other than as noted in HPI.  Physical Exam Updated Vital Signs BP (!) 158/74 (BP Location: Left Arm)   Pulse 82   Temp 98.8 F (37.1 C) (Oral)   Resp 17   Ht 5\' 3"  (1.6 m)   Wt 72.6 kg   SpO2 99%   BMI 28.34 kg/m   Physical Exam Vitals and nursing note reviewed.  Constitutional:      General: She is not in acute distress.    Appearance: She is well-developed.  HENT:     Head: Normocephalic and atraumatic.  Eyes:     General:        Right eye: No discharge.  Left eye: No discharge.     Conjunctiva/sclera: Conjunctivae normal.  Cardiovascular:     Rate and Rhythm: Normal rate and regular rhythm.     Heart sounds: Normal heart sounds. No murmur heard.  No friction rub. No gallop.   Pulmonary:     Effort: Pulmonary effort is normal. No respiratory distress.     Breath sounds: Wheezing present.     Comments: Sitting up in bed.  No acute distress.  Speaks in complete sentences.  Mild expiratory wheezing. Abdominal:     General: There is no distension.     Palpations: Abdomen is soft.     Tenderness: There is no abdominal tenderness.  Musculoskeletal:        General: No tenderness.     Cervical back: Neck supple.     Comments: Lower extremities symmetric as compared to each other. No calf tenderness. Negative  Homan's. No palpable cords.   Skin:    General: Skin is warm and dry.  Neurological:     Mental Status: She is alert.  Psychiatric:        Behavior: Behavior normal.        Thought Content: Thought content normal.    ED Results / Procedures / Treatments   Labs (all labs ordered are listed, but only abnormal results are displayed) Labs Reviewed - No data to display  EKG EKG Interpretation  Date/Time:  Friday March 17 2020 12:38:45 EDT Ventricular Rate:  77 PR Interval:  124 QRS Duration: 142 QT Interval:  430 QTC Calculation: 486 R Axis:   -74 Text Interpretation: Normal sinus rhythm Left axis deviation Right bundle branch block Inferior infarct , age undetermined Anterior infarct , age undetermined Abnormal ECG Confirmed by Raeford Razor [phone]) on 03/17/2020 2:26:07 PM   Radiology DG Chest Port 1 View  Result Date: 03/17/2020 CLINICAL DATA:  Cough, congestion and shortness of breath for 1 week. EXAM: PORTABLE CHEST 1 VIEW COMPARISON:  PA and lateral chest 03/02/2020. FINDINGS: The patient is status post aortic valve replacement. Heart size is normal. Aortic atherosclerosis. Lungs clear. No pneumothorax or pleural fluid. IMPRESSION: No acute disease. Aortic Atherosclerosis (ICD10-I70.0). Electronically Signed   By: Drusilla Kanner M.D.   On: 03/17/2020 12:56    Procedures Procedures (including critical care time)  Medications Ordered in ED Medications  dexamethasone (DECADRON) tablet 8 mg (has no administration in time range)  albuterol (VENTOLIN HFA) 108 (90 Base) MCG/ACT inhaler 2 puff (has no administration in time range)    ED Course  I have reviewed the triage vital signs and the nursing notes.  Pertinent labs & imaging results that were available during my care of the patient were reviewed by me and considered in my medical decision making (see chart for details).    MDM Rules/Calculators/A&P                          69 year old female with 1 week history  of cough.  Mild wheezing on exam.  Chest x-ray is clear.  Afebrile.  Likely bronchitis.  Albuterol MDI.  Dose of Decadron given.  Final Clinical Impression(s) / ED Diagnoses Final diagnoses:  Bronchitis    Rx / DC Orders ED Discharge Orders    None       Raeford Razor, MD 03/17/20 1431

## 2020-03-17 NOTE — ED Triage Notes (Signed)
Pt with a cough for a week. She has been taking mucinex w/o relief. No fevers.

## 2020-04-11 ENCOUNTER — Other Ambulatory Visit: Payer: Self-pay

## 2020-04-11 ENCOUNTER — Encounter: Payer: Self-pay | Admitting: Plastic Surgery

## 2020-04-11 ENCOUNTER — Ambulatory Visit (INDEPENDENT_AMBULATORY_CARE_PROVIDER_SITE_OTHER): Payer: Medicare Other | Admitting: Plastic Surgery

## 2020-04-11 VITALS — BP 146/86 | HR 71 | Temp 98.4°F

## 2020-04-11 DIAGNOSIS — L910 Hypertrophic scar: Secondary | ICD-10-CM | POA: Diagnosis not present

## 2020-04-11 NOTE — Progress Notes (Signed)
Procedure Note  Preoperative Dx: Sternal keloid  Postoperative Dx: Same  Procedure: Steroid injection to sternal keloid 1 x 20 cm   Description of Procedure: Risks and complications were explained to the patient.  Consent was confirmed and the patient understands the risks and benefits.  The potential complications and alternatives were explained and the patient consents.  The patient expressed understanding the option of not having the procedure and the risks of a scar.  Time out was called and all information was confirmed to be correct.    The area was prepped and drapped.  Lidocaine 1% with epinepherine 0.2 cc was mixed with Kenalog 10 mg/1 ml 0.4 cc.  The entire keloid was injected.  A dressing was applied.  The patient was given instructions on how to care for the area and a follow up appointment.  Joann Brooks tolerated the procedure well and there were no complications. The specimen was marked long stitch lateral and short superior then sent to pathology with the biopsy report from the referring physician. On the picture it does appear that it is improving with the treatment. The patient still complains of itching.  Pictures were obtained of the patient and placed in the chart with the patient's or guardian's permission.

## 2020-07-11 ENCOUNTER — Encounter: Payer: Self-pay | Admitting: Plastic Surgery

## 2020-07-11 ENCOUNTER — Other Ambulatory Visit: Payer: Self-pay

## 2020-07-11 ENCOUNTER — Ambulatory Visit (INDEPENDENT_AMBULATORY_CARE_PROVIDER_SITE_OTHER): Payer: Medicare Other | Admitting: Plastic Surgery

## 2020-07-11 VITALS — BP 153/82 | HR 72 | Temp 98.1°F

## 2020-07-11 DIAGNOSIS — L910 Hypertrophic scar: Secondary | ICD-10-CM

## 2020-07-11 NOTE — Progress Notes (Signed)
   Subjective:    Patient ID: Joann Brooks, female    DOB: 10/16/1950, 69 y.o.   MRN: [id number]  The patient is a 69 year old female here for a follow-up on her chest keloid.  According to the pictures it does appear to be stable.  It does not appear to be getting any worse.  It might be getting better.  She has had 3 steroid injections.  They were with a minimal amount of Kenalog.  She is mostly concerned with some itching that she gets and tightness.  She got a booster vaccine today so she is not wanting to get the injection today.  Overall she is otherwise doing well.  Her diabetes is controlled.     Review of Systems  Constitutional: Negative.   HENT: Negative.   Eyes: Negative.   Respiratory: Negative.   Gastrointestinal: Negative.   Endocrine: Negative.   Genitourinary: Negative.        Objective:   Physical Exam Vitals and nursing note reviewed.  Constitutional:      Appearance: Normal appearance.  HENT:     Head: Normocephalic and atraumatic.  Cardiovascular:     Rate and Rhythm: Normal rate.     Pulses: Normal pulses.  Pulmonary:     Effort: Pulmonary effort is normal.  Chest:    Neurological:     General: No focal deficit present.     Mental Status: She is alert. Mental status is at baseline.  Psychiatric:        Mood and Affect: Mood normal.        Behavior: Behavior normal.        Assessment & Plan:     ICD-10-CM   1. Hypertrophic scar  L91.0     Pictures were obtained of the patient and placed in the chart with the patient's or guardian's permission. The patient wants to wait a week and let her booster shot settle him before she does another injection.  This might be the last 1 at least for a little while.

## 2020-07-14 ENCOUNTER — Telehealth: Payer: Self-pay

## 2020-07-14 NOTE — Telephone Encounter (Signed)
Patient called to request a letter stating the number of injections she has had for her chest keloid and the dates of the injections.  She asked me to mail it to [address], [place], [place] [address].

## 2020-07-18 ENCOUNTER — Ambulatory Visit: Payer: Medicare Other | Admitting: Plastic Surgery

## 2020-09-12 ENCOUNTER — Ambulatory Visit (INDEPENDENT_AMBULATORY_CARE_PROVIDER_SITE_OTHER): Payer: Medicare Other | Admitting: Plastic Surgery

## 2020-09-12 ENCOUNTER — Encounter: Payer: Self-pay | Admitting: Plastic Surgery

## 2020-09-12 ENCOUNTER — Other Ambulatory Visit: Payer: Self-pay

## 2020-09-12 VITALS — BP 169/83

## 2020-09-12 DIAGNOSIS — L910 Hypertrophic scar: Secondary | ICD-10-CM

## 2020-09-12 NOTE — Progress Notes (Signed)
Patient is a 70 year old female here for follow-up on her chest keloid.  She has had 3 steroid injections with a minimal amount of Kenalog.  Patient reports keloid scar still bothers her.  Denies fever/chills, nausea/vomiting.  She has some tenderness approximately one third of the way down the keloid.  She also reports some itching.  At this time keloid appears to be stable from her last photos.  No signs of infection.  She asked about several options for keloid treatment.  Some of them she has already tried. Discussed possibility of excision of the keloid with Dr. Ulice Bold and surgery would be too great of a risk for her.  She may do additional Kenalog injections if she feels that the keloid is increasing in size.  She reports these are pretty painful so she would like to wait and consider one possibly in the near future.  She may be a good candidate for the new laser we are getting.  Recommend she call in approximately 2 months to confirm we have this in and if this would be a good option for her.  Patient in agreement with plan.

## 2021-01-09 ENCOUNTER — Other Ambulatory Visit: Payer: Self-pay

## 2021-01-09 ENCOUNTER — Encounter: Payer: Self-pay | Admitting: Surgical

## 2021-01-09 ENCOUNTER — Ambulatory Visit (INDEPENDENT_AMBULATORY_CARE_PROVIDER_SITE_OTHER): Payer: Medicare Other | Admitting: Surgical

## 2021-01-09 DIAGNOSIS — L910 Hypertrophic scar: Secondary | ICD-10-CM | POA: Diagnosis not present

## 2021-01-09 NOTE — Progress Notes (Signed)
   Referring Provider Zanard, Hinton Dyer, MD [address] [place],  [place] [address]   CC:  Chief Complaint  Patient presents with   Follow-up      Joann Brooks is an 70 y.o. female.  HPI: Patient is a 70 year old female here for reevaluation of her chest keloid.  She is here for reevaluation due to concern for the area growing as well as ongoing itching and tightness that is bothersome to her.  She is interested in discussing options for improvement of this area.   Review of Systems General: No fevers or chills  Physical Exam Vitals with BMI 09/12/2020 07/11/2020 04/11/2020  Height - - -  Weight - - -  BMI - - -  Systolic 169 153 762  Diastolic 83 82 86  Pulse - 72 71    General:  No acute distress,  Alert and oriented, Non-Toxic, Normal speech and affect Chest: Chest keloid appears stable.  Incision extends across the entire sternum.  No surrounding erythema.  Does not appear to be increasing in size per review of EMR photos.  There is minimal tenderness with palpation.  Assessment/Plan 70 year old female with chest keloid after cardiac surgery.  She has previously had steroid injections with her last injection being 04/11/2020.  She is interested in other options for improving the area.  She has tried steroid injections and silicone scar patches.  She is possibly interested in further steroid injections.  I discussed with the patient that we could schedule her for an additional steroid injection in the next few weeks. Picture was taken and placed in the patient's chart with patient permission.  All of her questions were answered to her content.  Kermit Balo Edlin Ford 01/09/2021, 11:18 AM

## 2021-03-09 ENCOUNTER — Encounter (HOSPITAL_BASED_OUTPATIENT_CLINIC_OR_DEPARTMENT_OTHER): Payer: Self-pay

## 2021-03-09 ENCOUNTER — Other Ambulatory Visit: Payer: Self-pay

## 2021-03-09 DIAGNOSIS — Z7984 Long term (current) use of oral hypoglycemic drugs: Secondary | ICD-10-CM | POA: Diagnosis not present

## 2021-03-09 DIAGNOSIS — Z79899 Other long term (current) drug therapy: Secondary | ICD-10-CM | POA: Diagnosis not present

## 2021-03-09 DIAGNOSIS — E119 Type 2 diabetes mellitus without complications: Secondary | ICD-10-CM | POA: Insufficient documentation

## 2021-03-09 DIAGNOSIS — I10 Essential (primary) hypertension: Secondary | ICD-10-CM | POA: Diagnosis not present

## 2021-03-09 DIAGNOSIS — R059 Cough, unspecified: Secondary | ICD-10-CM | POA: Insufficient documentation

## 2021-03-09 DIAGNOSIS — R42 Dizziness and giddiness: Secondary | ICD-10-CM | POA: Insufficient documentation

## 2021-03-09 DIAGNOSIS — Z7982 Long term (current) use of aspirin: Secondary | ICD-10-CM | POA: Diagnosis not present

## 2021-03-09 LAB — CBC
HCT: 40.9 % (ref 36.0–46.0)
Hemoglobin: 13.4 g/dL (ref 12.0–15.0)
MCH: 27.2 pg (ref 26.0–34.0)
MCHC: 32.8 g/dL (ref 30.0–36.0)
MCV: 83 fL (ref 80.0–100.0)
Platelets: 224 10*3/uL (ref 150–400)
RBC: 4.93 MIL/uL (ref 3.87–5.11)
RDW: 14.6 % (ref 11.5–15.5)
WBC: 10.9 10*3/uL — ABNORMAL HIGH (ref 4.0–10.5)
nRBC: 0 % (ref 0.0–0.2)

## 2021-03-09 LAB — BASIC METABOLIC PANEL
Anion gap: 11 (ref 5–15)
BUN: 17 mg/dL (ref 8–23)
CO2: 25 mmol/L (ref 22–32)
Calcium: 9.3 mg/dL (ref 8.9–10.3)
Chloride: 106 mmol/L (ref 98–111)
Creatinine, Ser: 1.02 mg/dL — ABNORMAL HIGH (ref 0.44–1.00)
GFR, Estimated: 59 mL/min — ABNORMAL LOW (ref >60)
Glucose, Bld: 97 mg/dL (ref 70–99)
Potassium: 3.4 mmol/L — ABNORMAL LOW (ref 3.5–5.1)
Sodium: 142 mmol/L (ref 135–145)

## 2021-03-09 LAB — URINALYSIS, ROUTINE W REFLEX MICROSCOPIC
Bilirubin Urine: NEGATIVE
Glucose, UA: NEGATIVE mg/dL
Hgb urine dipstick: NEGATIVE
Ketones, ur: NEGATIVE mg/dL
Nitrite: NEGATIVE
Protein, ur: NEGATIVE mg/dL
Specific Gravity, Urine: 1.015 (ref 1.005–1.030)
pH: 6 (ref 5.0–8.0)

## 2021-03-09 LAB — URINALYSIS, MICROSCOPIC (REFLEX)

## 2021-03-09 NOTE — ED Triage Notes (Addendum)
Pt c/o dizziness  x 2 days-also c/o dry cough started yesterday-denies fever-states she had +covid exposure with neg covid test at [facility] 8/8-states she does not repeat covid test at this time-NAD-steady gait

## 2021-03-10 ENCOUNTER — Emergency Department (HOSPITAL_BASED_OUTPATIENT_CLINIC_OR_DEPARTMENT_OTHER)
Admission: EM | Admit: 2021-03-10 | Discharge: 2021-03-10 | Disposition: A | Discharge status: Home / Self Care | Payer: Medicare Other | Attending: Emergency Medicine | Admitting: Emergency Medicine

## 2021-03-10 ENCOUNTER — Emergency Department (HOSPITAL_BASED_OUTPATIENT_CLINIC_OR_DEPARTMENT_OTHER): Payer: Medicare Other

## 2021-03-10 DIAGNOSIS — R059 Cough, unspecified: Secondary | ICD-10-CM

## 2021-03-10 DIAGNOSIS — R42 Dizziness and giddiness: Secondary | ICD-10-CM

## 2021-03-10 LAB — CBG MONITORING, ED: Glucose-Capillary: 91 mg/dL (ref 70–99)

## 2021-03-10 NOTE — ED Provider Notes (Signed)
[facility] EMERGENCY DEPARTMENT Provider Note   CSN: [id number] Arrival date & time: 03/09/21  2002     History Chief Complaint  Patient presents with   Dizziness   Cough    Joann Brooks is a 70 y.o. female.  The history is provided by the patient.  Dizziness Cough Joann Brooks is a 70 y.o. female who presents to the Emergency Department complaining of dizziness.  She presents to the ED for evaluation of dizziness that started two days ago.  Sxs are described as spinning sensation when she moves to get up from a supine position.  Sxs go away at rest.  Has occasional dry cough.    No fever, chest pain, N/V, SOB, leg swelling/pain.    Has a similar episode about one year ago.  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 No pertinent surgical history.   OB History   No obstetric history on file.     No family history on file.  Social History   Tobacco Use   Smoking status: Never   Smokeless tobacco: Never  Substance Use Topics   Alcohol use: Not Currently   Drug use: Not Currently    Home Medications Prior to Admission medications   Medication Sig Start Date End Date Taking? Authorizing Provider  amLODipine (NORVASC) 5 MG tablet Take 5 mg by mouth daily. 02/22/19   [provider]  ASPIRIN LOW DOSE 81 MG EC tablet Take 81 mg by mouth daily. 01/29/19   [provider]  atorvastatin (LIPITOR) 80 MG tablet Take 80 mg by mouth daily.    [provider]  cholecalciferol (VITAMIN D) 1000 units tablet Take 1,000 Units by mouth daily.    [provider]  erythromycin ophthalmic ointment Place 1  application into both eyes every 6 (six) hours. Place 1/2 inch ribbon of ointment in the affected eye 4 times a day 11/09/16   Everlene Farrier, PA-C  escitalopram (LEXAPRO) 5 MG tablet Take by mouth. 12/29/18   [provider]  fluocinonide cream (LIDEX) 0.05 % Apply 1 application topically 2 (two) times daily. 03/07/20   Dillingham, Alena Bills, DO  fluticasone (FLONASE) 50 MCG/ACT nasal spray Place 2 sprays into both nostrils daily. 11/09/16   Everlene Farrier, PA-C  lisinopril (ZESTRIL) 20 MG tablet Take by mouth. 02/22/19   [provider]  metFORMIN (GLUCOPHAGE-XR) 500 MG 24 hr tablet Take by mouth. 12/29/18   [provider]  metoprolol succinate (TOPROL-XL) 50 MG 24 hr tablet Take 50 mg by mouth daily. 02/22/19   [provider]    Allergies    Clarithromycin  Review of Systems   Review of Systems  Respiratory:  Positive for cough.   Neurological:  Positive for dizziness.  All other systems reviewed and are negative.  Physical Exam Updated Vital Signs BP (!) 156/74   Pulse 91   Temp 98.4 F (36.9 C) (Oral)   Resp 20   Ht 5\' 3"  (1.6 m)   Wt 68 kg   SpO2 99%   BMI 26.57 kg/m   Physical Exam Vitals and nursing note reviewed.  Constitutional:  Appearance: She is well-developed.  HENT:     Head: Normocephalic and atraumatic.  Cardiovascular:     Rate and Rhythm: Normal rate and regular rhythm.     Heart sounds: Murmur heard.  Pulmonary:     Effort: Pulmonary effort is normal. No respiratory distress.     Breath sounds: Normal breath sounds.  Abdominal:     Palpations: Abdomen is soft.     Tenderness: There is no abdominal tenderness. There is no guarding or rebound.  Musculoskeletal:        General: No tenderness.  Skin:    General: Skin is warm and dry.  Neurological:     Mental Status: She is alert and oriented to person, place, and time.     Comments: No asymmetry of facial movements.  5/5 strength in all four extremities.  No ataxia  on FTN bilaterally  Psychiatric:        Behavior: Behavior normal.    ED Results / Procedures / Treatments   Labs (all labs ordered are listed, but only abnormal results are displayed) Labs Reviewed  BASIC METABOLIC PANEL - Abnormal; Notable for the following components:      Result Value   Potassium 3.4 (*)    Creatinine, Ser 1.02 (*)    GFR, Estimated 59 (*)    All other components within normal limits  CBC - Abnormal; Notable for the following components:   WBC 10.9 (*)    All other components within normal limits  URINALYSIS, ROUTINE W REFLEX MICROSCOPIC - Abnormal; Notable for the following components:   Leukocytes,Ua SMALL (*)    All other components within normal limits  URINALYSIS, MICROSCOPIC (REFLEX) - Abnormal; Notable for the following components:   Bacteria, UA RARE (*)    All other components within normal limits  CBG MONITORING, ED    EKG EKG Interpretation  Date/Time:  Friday March 09 2021 20:18:16 EDT Ventricular Rate:  105 PR Interval:  116 QRS Duration: 134 QT Interval:  392 QTC Calculation: 518 R Axis:   -76 Text Interpretation: Sinus tachycardia Left axis deviation Right bundle branch block Inferior infarct , age undetermined Anterior infarct , age undetermined Abnormal ECG old RBBB unchanged from previous except increased rate Confirmed by Arby Barrette [phone]) on 03/09/2021 8:24:29 PM  Radiology DG Chest 2 View  Result Date: 03/10/2021 CLINICAL DATA:  Cough for 2 days EXAM: CHEST - 2 VIEW COMPARISON:  03/17/2020 FINDINGS: Cardiac shadow is within normal limits. Postsurgical changes are again seen. Lungs are well aerated bilaterally. No bony abnormality is seen. IMPRESSION: No active cardiopulmonary disease. Electronically Signed   By: Alcide Clever M.D.   On: 03/10/2021 01:11   CT Head Wo Contrast  Result Date: 03/10/2021 CLINICAL DATA:  Dizziness for 2 days EXAM: CT HEAD WITHOUT CONTRAST TECHNIQUE: Contiguous axial images were obtained from the  base of the skull through the vertex without intravenous contrast. COMPARISON:  None. FINDINGS: Brain: No evidence of acute infarction, hemorrhage, hydrocephalus, extra-axial collection or mass lesion/mass effect. Vascular: No hyperdense vessel or unexpected calcification. Skull: Normal. Negative for fracture or focal lesion. Sinuses/Orbits: No acute finding. Other: None. IMPRESSION: No acute intracranial abnormality noted. Electronically Signed   By: Alcide Clever M.D.   On: 03/10/2021 01:39    Procedures Procedures   Medications Ordered in ED Medications - No data to display  ED Course  I have reviewed the triage vital signs and the nursing notes.  Pertinent labs & imaging results that were available during my  care of the patient were reviewed by me and considered in my medical decision making (see chart for details).    MDM Rules/Calculators/A&P                          patient here for evaluation of two days of positional dizziness that extinguishes on rest. Her symptoms are overall improving. She did have a COVID exposure over a week ago and did have an outpatient test that was negative on Monday. She declined additional testing today. She has no focal neurologic deficits on examination. Imaging is negative for acute abnormality. Labs without significant anemia. There is minimal hypokalemia on her labs. She is asymptomatic during her ED stay. Presentation is not consistent with CVA. Discussed with patient home care for vertigo with outpatient follow-up and return precautions.  Final Clinical Impression(s) / ED Diagnoses Final diagnoses:  Dizziness  Cough    Rx / DC Orders ED Discharge Orders     None        Tilden Fossa, MD 03/10/21 [phone]

## 2021-09-05 IMAGING — DX DG CHEST 1V PORT
1 series · 1 of 1 positions shown · non-contrast
Comparison: PA and lateral chest 03/02/2020.

CLINICAL DATA: Cough, congestion and shortness of breath for 1
week.

EXAM:
PORTABLE CHEST 1 VIEW

[chest ap]
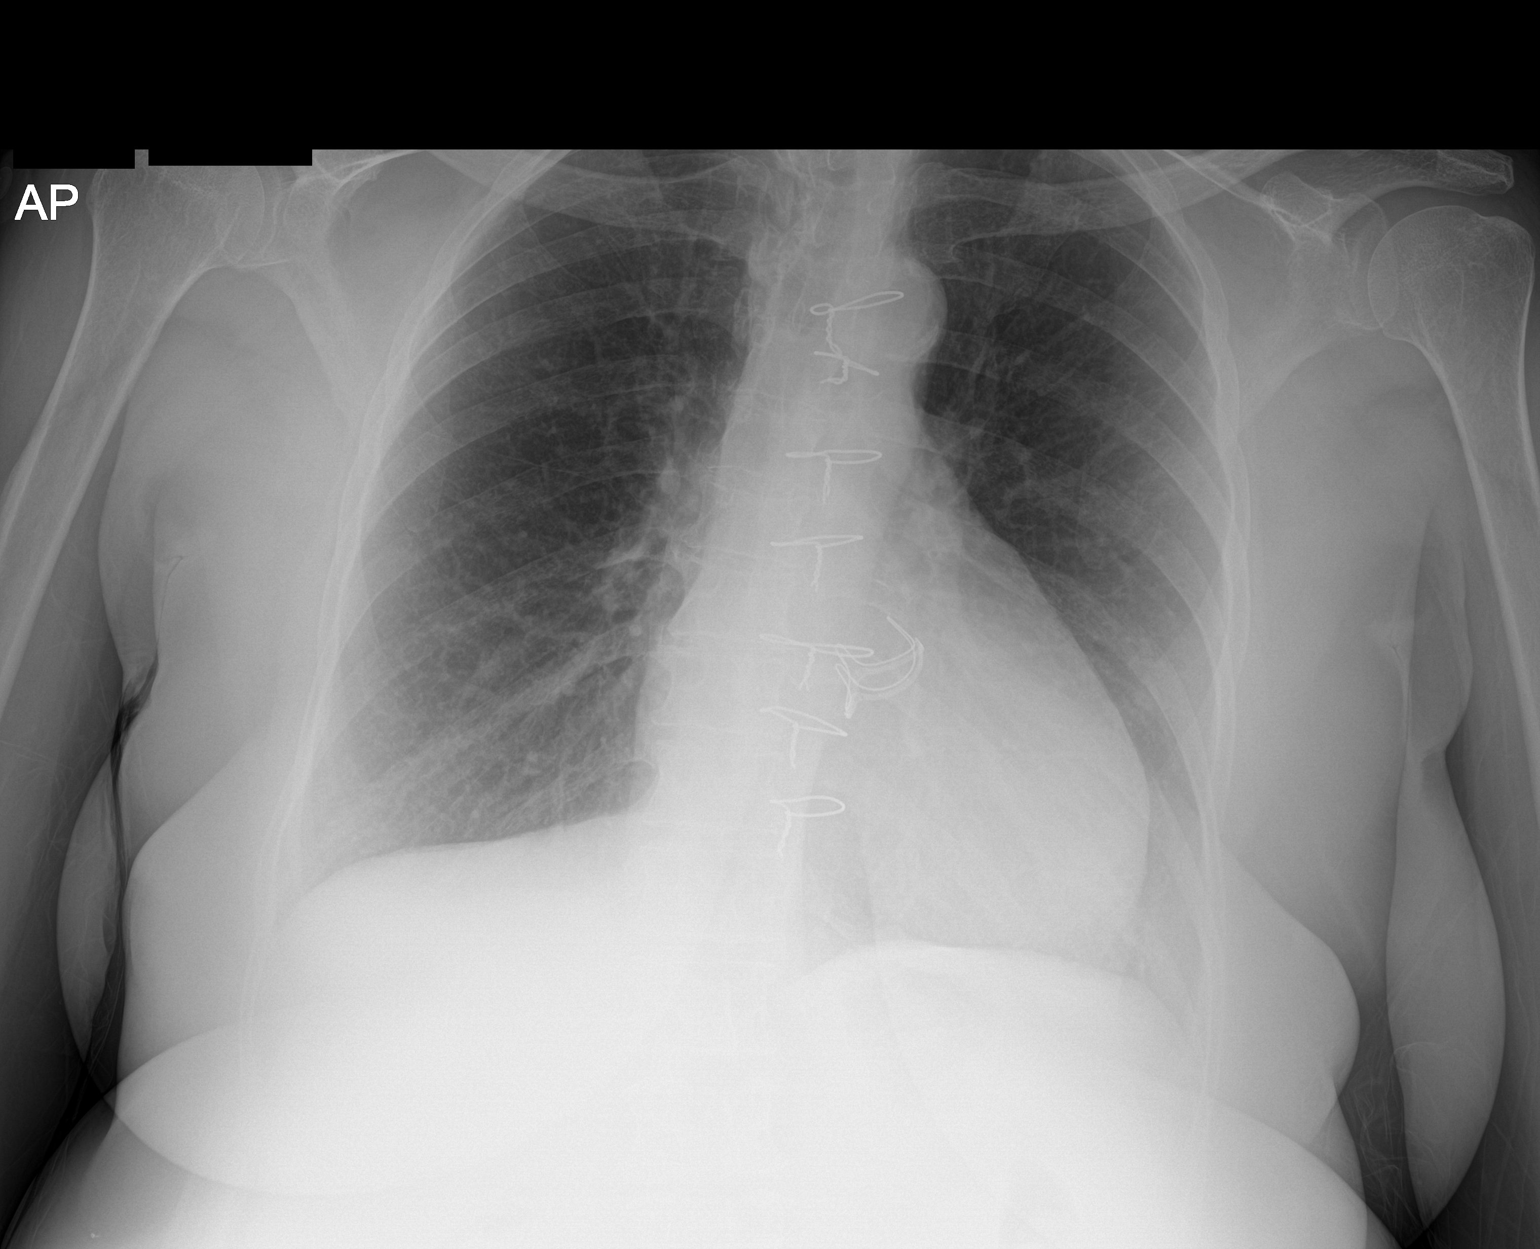

[1 of 1 positions shown; findings below may reference images not displayed]

FINDINGS: The patient is status post aortic valve replacement. Heart size is
normal. Aortic atherosclerosis. Lungs clear. No pneumothorax or
pleural fluid.
IMPRESSION: No acute disease.

Aortic Atherosclerosis ([id number]).

## 2022-08-29 IMAGING — CT CT HEAD W/O CM
4 series · 17 of 47 positions shown, 19 images · non-contrast
Comparison: None.

CLINICAL DATA: Dizziness for 2 days

EXAM:
CT HEAD WITHOUT CONTRAST
TECHNIQUE: Contiguous axial images were obtained from the base of the skull
through the vertex without intravenous contrast.

[Series 2: head wo · axial · 0.42mm/px · z∈[+829,+949]mm · 7 of 34 slices shown, 9 images]
[im 5/34  brain]
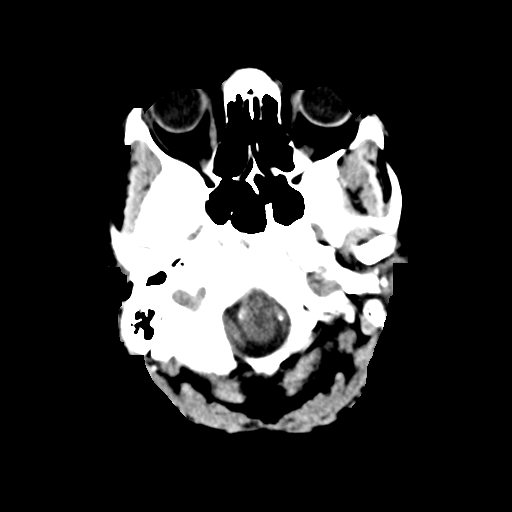
[im 5/34  bone]
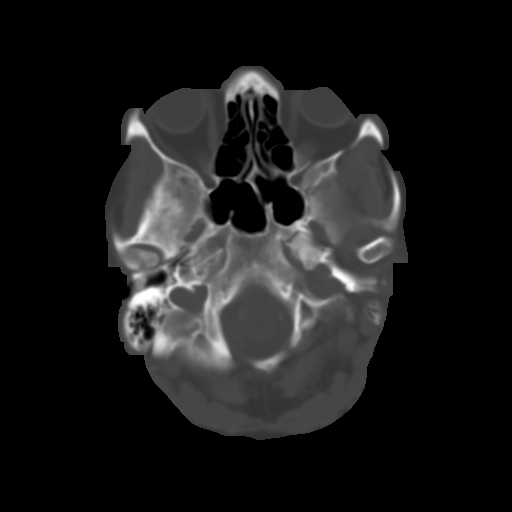
[im 9/34  brain]
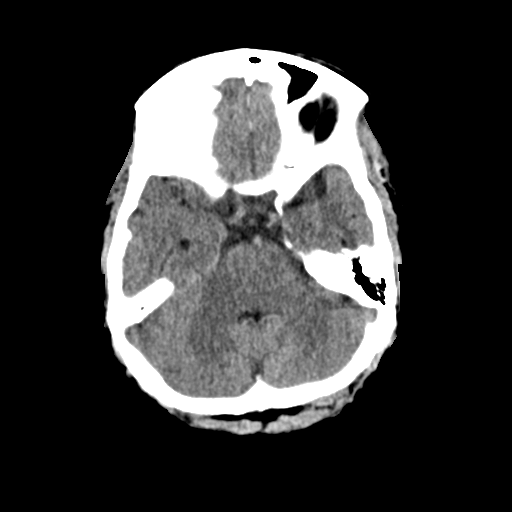
[im 13/34  brain]
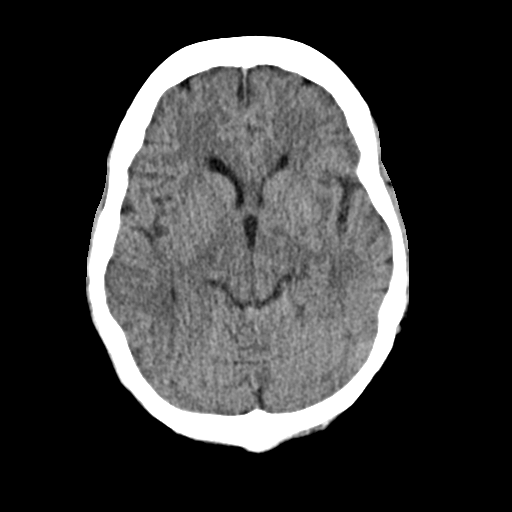
[im 17/34  brain]
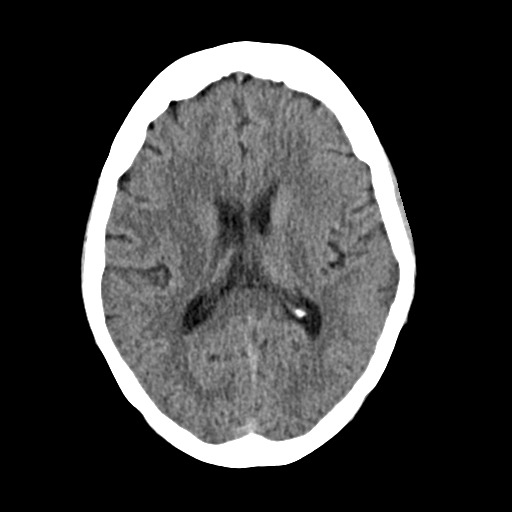
[im 21/34  brain]
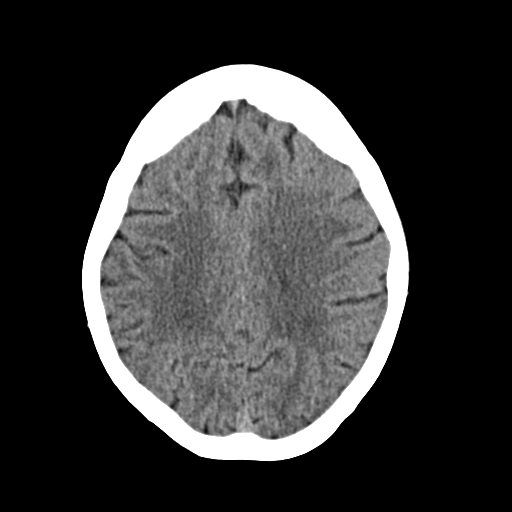
[im 21/34  bone]
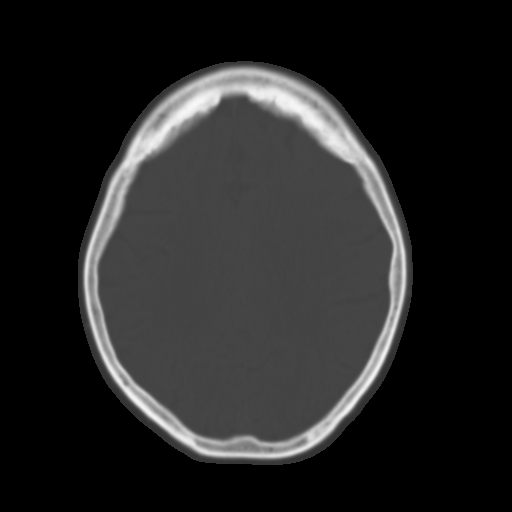
[im 25/34  brain]
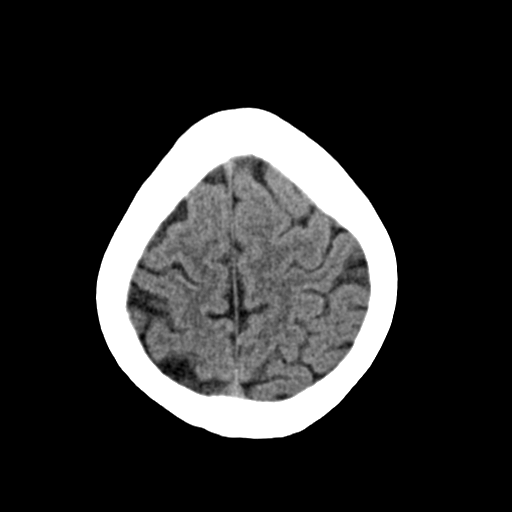
[im 29/34  brain]
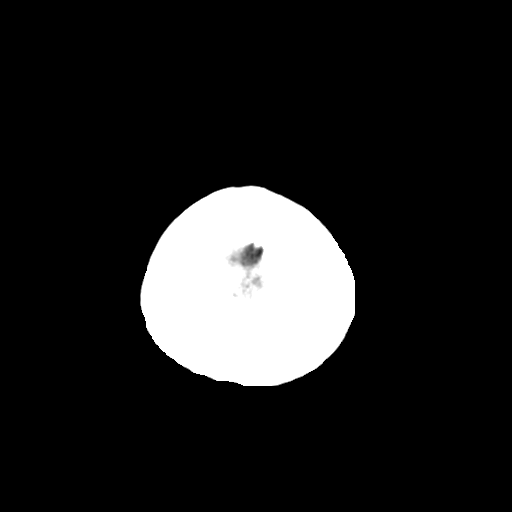

[Series 3: head bone · axial · 0.42mm/px · z∈[+825,+883]mm · 4 of 84 slices shown]
[im 9/84  bone]
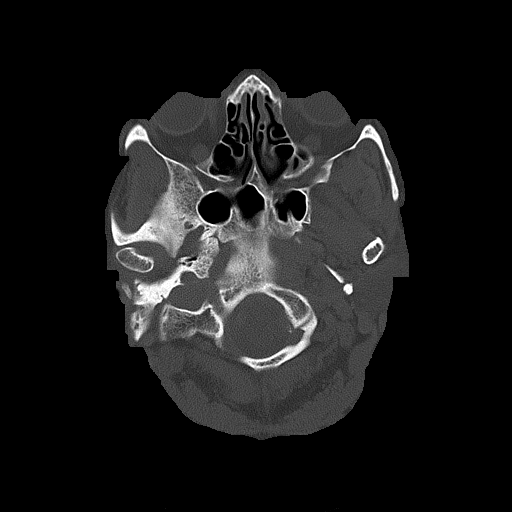
[im 17/84  bone]
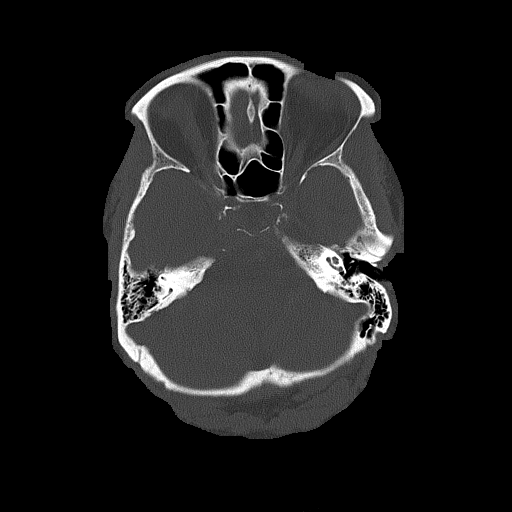
[im 25/84  bone]
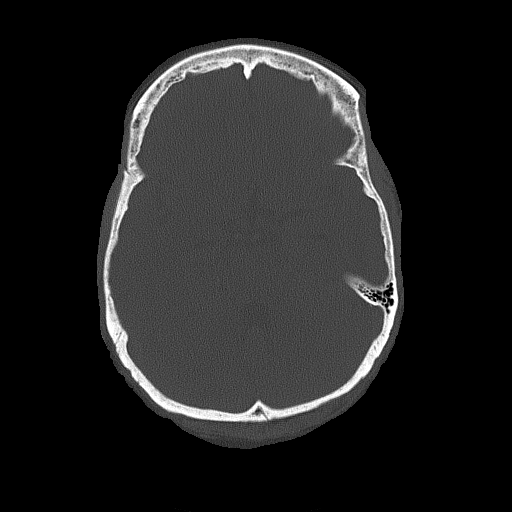
[im 38/84  bone]
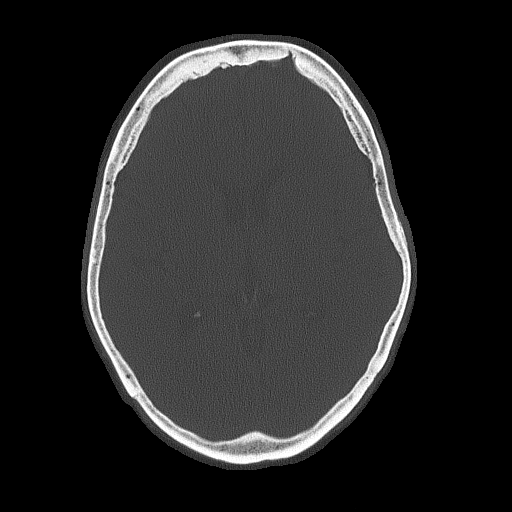

[Series 4: coronal soft · coronal · 0.32mm/px · 3 of 67 slices shown]
[im 23/67  brain]
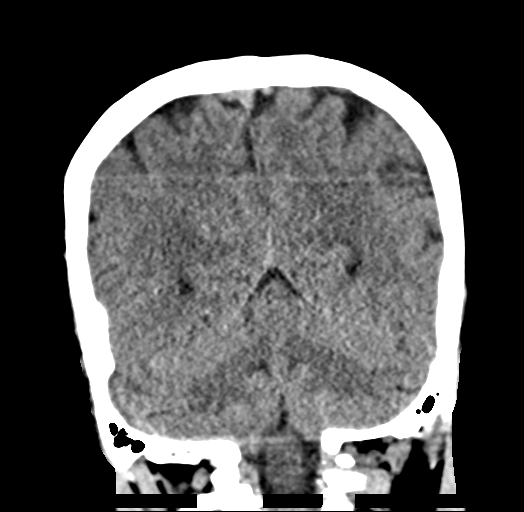
[im 30/67  brain]
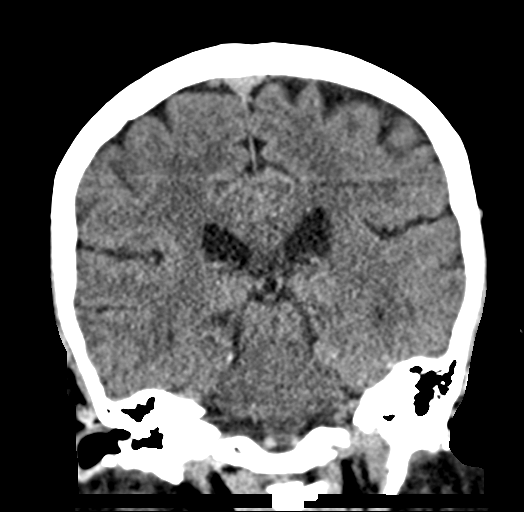
[im 37/67  brain]
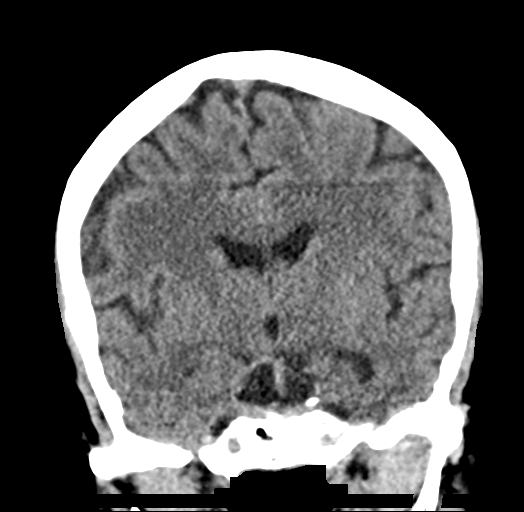

[Series 5: sag soft · sagittal · 0.32mm/px · 3 of 59 slices shown]
[im 20/59  brain]
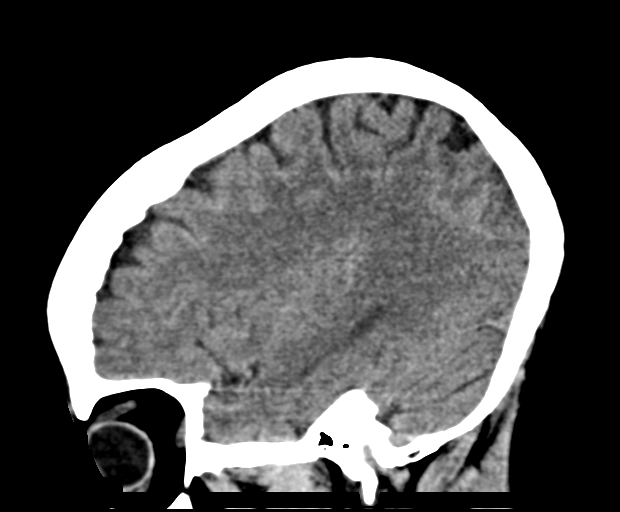
[im 30/59  brain]
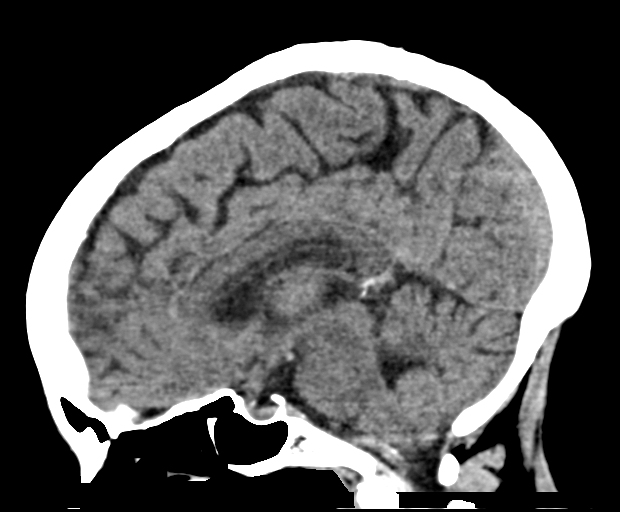
[im 39/59  brain]
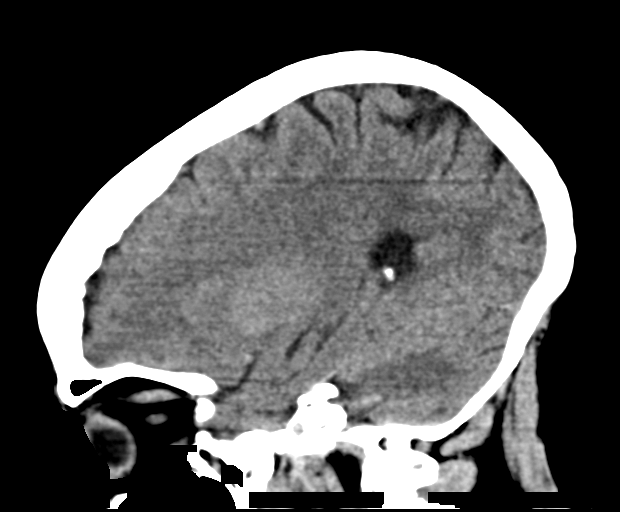

[17 of 47 positions shown; findings below may reference images not displayed]

FINDINGS: Brain: No evidence of acute infarction, hemorrhage, hydrocephalus,
extra-axial collection or mass lesion/mass effect.

Vascular: No hyperdense vessel or unexpected calcification.

Skull: Normal. Negative for fracture or focal lesion.

Sinuses/Orbits: No acute finding.

Other: None.
IMPRESSION: No acute intracranial abnormality noted.
